# Patient Record
Sex: Female | Born: 1960 | Race: White | Hispanic: No | Marital: Married | Smoking: Former smoker
Health system: Southern US, Community
[De-identification: ages and names within clinical notes are randomized; demographics above are authoritative.]

## PROBLEM LIST (undated history)

## (undated) DIAGNOSIS — F419 Anxiety disorder, unspecified: Secondary | ICD-10-CM

## (undated) DIAGNOSIS — F32A Depression, unspecified: Secondary | ICD-10-CM

## (undated) DIAGNOSIS — N200 Calculus of kidney: Secondary | ICD-10-CM

## (undated) DIAGNOSIS — K8020 Calculus of gallbladder without cholecystitis without obstruction: Secondary | ICD-10-CM

## (undated) DIAGNOSIS — F329 Major depressive disorder, single episode, unspecified: Secondary | ICD-10-CM

## (undated) DIAGNOSIS — D649 Anemia, unspecified: Secondary | ICD-10-CM

## (undated) HISTORY — DX: Depression, unspecified: F32.A

## (undated) HISTORY — DX: Calculus of gallbladder without cholecystitis without obstruction: K80.20

## (undated) HISTORY — DX: Calculus of kidney: N20.0

## (undated) HISTORY — DX: Anxiety disorder, unspecified: F41.9

---
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
## 2018-12-12 ENCOUNTER — Encounter: Payer: Self-pay | Admitting: Orthopaedic Surgery

## 2018-12-12 ENCOUNTER — Ambulatory Visit (INDEPENDENT_AMBULATORY_CARE_PROVIDER_SITE_OTHER): Payer: Commercial Managed Care - PPO

## 2018-12-12 ENCOUNTER — Ambulatory Visit (INDEPENDENT_AMBULATORY_CARE_PROVIDER_SITE_OTHER): Payer: Commercial Managed Care - PPO | Admitting: Orthopaedic Surgery

## 2018-12-12 ENCOUNTER — Other Ambulatory Visit: Payer: Self-pay

## 2018-12-12 VITALS — Ht 68.0 in | Wt 195.0 lb

## 2018-12-12 DIAGNOSIS — M25562 Pain in left knee: Secondary | ICD-10-CM | POA: Diagnosis not present

## 2018-12-12 DIAGNOSIS — M2242 Chondromalacia patellae, left knee: Secondary | ICD-10-CM | POA: Diagnosis not present

## 2018-12-12 NOTE — Progress Notes (Signed)
Office Visit Note   Patient: Marie Douglas           Date of Birth: 1960/10/21           MRN: [id number] Visit Date: 12/12/2018              Requested by: No referring provider defined for this encounter. PCP: No primary care provider on file.   Assessment & Plan: Visit Diagnoses:  1. Acute pain of left knee   2. Chondromalacia patellae, left knee     Plan: Patient patient may have had partial patellar subluxation or more likely chondromalacia with small cartilage flap tear in the trochlear groove.  7 acute pain we will set her up for some physical therapy for terminal arc extensions straight leg raising isometric quad exercises.  I will recheck her again in 5 weeks.  7 persistent symptoms will obtain an MRI to rule out cartilage flap tear.  Follow-Up Instructions: Return in about 5 weeks (around 01/16/2019).   Orders:  Orders Placed This Encounter  Procedures  . XR KNEE 3 VIEW LEFT   No orders of the defined types were placed in this encounter.     Procedures: No procedures performed   Clinical Data: No additional findings.   Subjective: Chief Complaint  Patient presents with  . Left Knee - Pain    HPI 58 year old female seen with acute onset of left knee pain 3 weeks ago.  She woke up with the pain past history of patellar subluxation when she was a teenager.  She had another episode when she was standing on a chair or couch trying to reach the ceiling fan slipped and fell not sure if her knee went out before she fell or she injured it when she landed.  She has bought a knee sleeve she is using crutches she is used ice.  She has had some problems with sciatica in the past.  She denies associated back pain severe with this current knee symptoms.  Opposite right knee has had some crepitus but no subluxation problems.  Review of Systems positive sciatica depression anxiety.  No previous surgeries.  Non-smoker.  14 point system otherwise noncontributory to HPI.    Objective: Vital Signs: Ht 5\' 8"  (1.727 m)   Wt 195 lb (88.5 kg)   BMI 29.65 kg/m   Physical Exam Constitutional:      Appearance: She is well-developed.  HENT:     Head: Normocephalic.     Right Ear: External ear normal.     Left Ear: External ear normal.  Eyes:     Pupils: Pupils are equal, round, and reactive to light.  Neck:     Thyroid: No thyromegaly.     Trachea: No tracheal deviation.  Cardiovascular:     Rate and Rhythm: Normal rate.  Pulmonary:     Effort: Pulmonary effort is normal.  Abdominal:     Palpations: Abdomen is soft.  Skin:    General: Skin is warm and dry.  Neurological:     Mental Status: She is alert and oriented to person, place, and time.  Psychiatric:        Behavior: Behavior normal.     Ortho Exam patient is unable to ambulate without her crutches.  She has 2+ knee effusion.  Mild tenderness over the medial patellofemoral ligament negative lateral subluxation of the patella.  Distal pulses are intact.  Negative logroll of the hips.  Opposite right knee shows no knee effusion.  Pain with  patellofemoral compression.  She has sharp pain with attempts at active knee extension cannot make it the last 30 degrees. Negative patellar apprehension test. Specialty Comments:  No specialty comments available.  Imaging: Xr Knee 3 View Left  Result Date: 12/12/2018 Three-view x-rays left knee obtained and reviewed including AP lateral and sunrise patellar x-ray.  This is negative for acute changes.  There is some calcification at the attachment of the patella of the region of the medial patellofemoral ligament.  No definite loose body is seen.  Mild intercondylar eminence spurring. Impression: Left knee x-rays negative for acute changes.    PMFS History: There are no active problems to display for this patient.  Past Medical History:  Diagnosis Date  . Anxiety   . Depression     No family history on file.  History reviewed. No pertinent surgical  history. Social History   Occupational History  . Not on file  Tobacco Use  . Smoking status: Former Smoker    Packs/day: 1.00    Years: 10.00    Pack years: 10.00    Types: Cigarettes  . Smokeless tobacco: Never Used  Substance and Sexual Activity  . Alcohol use: Not Currently  . Drug use: Not on file  . Sexual activity: Not on file

## 2018-12-16 ENCOUNTER — Encounter (HOSPITAL_COMMUNITY): Payer: Self-pay | Admitting: Physical Therapy

## 2018-12-16 ENCOUNTER — Ambulatory Visit (HOSPITAL_COMMUNITY): Payer: Commercial Managed Care - PPO | Attending: Orthopaedic Surgery | Admitting: Physical Therapy

## 2018-12-16 ENCOUNTER — Other Ambulatory Visit: Payer: Self-pay

## 2018-12-16 DIAGNOSIS — M6281 Muscle weakness (generalized): Secondary | ICD-10-CM | POA: Insufficient documentation

## 2018-12-16 DIAGNOSIS — R600 Localized edema: Secondary | ICD-10-CM | POA: Diagnosis present

## 2018-12-16 DIAGNOSIS — M25662 Stiffness of left knee, not elsewhere classified: Secondary | ICD-10-CM | POA: Insufficient documentation

## 2018-12-16 DIAGNOSIS — M25562 Pain in left knee: Secondary | ICD-10-CM | POA: Insufficient documentation

## 2018-12-16 NOTE — Patient Instructions (Signed)
Quad Set    With other leg bent, foot flat, slowly tighten muscles on thigh of straight leg while counting out loud to __5__. Repeat with other leg. Repeat _10___ times. Do __1-2__ sessions per day.  http://gt2.exer.us/276   Copyright  VHI. All rights reserved.   

## 2018-12-16 NOTE — Therapy (Signed)
[place], [place], [address] Phone: [phone]   Fax:  [phone]  Physical Therapy Evaluation  Patient Details  Name: Marie Douglas MRN: [id number] Date of Birth: [birth year] Referring Provider (PT): Marybelle Killings, MD   Encounter Date: 12/16/2018  PT End of Session - 12/16/18 1408    Visit Number  1    Number of Visits  12    Date for PT Re-Evaluation  01/27/19   Mini Re-assess 01/06/19   Authorization Type  [place] Time Period  12/16/18 - 01/27/19    Authorization - Visit Number  1    Authorization - Number of Visits  10    PT Start Time  0845   Patient arrived late   PT Stop Time  0930    PT Time Calculation (min)  45 min    Equipment Utilized During Treatment  Gait belt    Activity Tolerance  Patient tolerated treatment well;Patient limited by pain    Behavior During Therapy  [facility] for tasks assessed/performed       Past Medical History:  Diagnosis Date  . Anxiety   . Depression     History reviewed. No pertinent surgical history.  There were no vitals filed for this visit.   Subjective Assessment - 12/16/18 0852    Subjective  Patient reported that about 3 weeks ago she woke up and her left knee was very painful. She said she was having some difficulty with ambulation following this. She stated she ordered a soft brace to help reduce the pain, but it didn't help. Patient reported that in 2004 her left knee had dislocated when she was on a couch reaching up and fell. She also reported that in 1977 that her knee had dislocated. She said both times the knee dislocated laterally. Patient reported that she's been using crutches to help her walk. Patient reported sometimes the back of her knee hurts. Patient denied any bowel and bladder changes and no tingling numbness.    Pertinent History  Patellar dislocation 2004 and one in 1977 per patient report    Limitations  Standing;Walking     How long can you sit comfortably?  Not nececearrily limited    How long can you stand comfortably?  less than 1 minute with weight on it    How long can you walk comfortably?  For several minutes with crutches    Diagnostic tests  X-ray of left knee negative for acute changes    Patient Stated Goals  Less pain    Currently in Pain?  Yes    Pain Score  3     Pain Location  Knee    Pain Orientation  Left    Pain Descriptors / Indicators  Aching    Pain Type  Acute pain    Pain Onset  1 to 4 weeks ago    Pain Frequency  Intermittent    Aggravating Factors   Standing, walking    Pain Relieving Factors  No activity    Effect of Pain on Daily Activities  severely limited         [facility] PT Assessment - 12/16/18 0001      Assessment   Medical Diagnosis  Acute pain of left knee, Chondromalcia patella, chondromalacia patellae, Left knee    Referring Provider (PT)  Marybelle Killings, MD    Onset Date/Surgical Date  --   About 3 weeks ago  Prior Therapy  Yes, when knee dislocated      Precautions   Precautions  Other (comment)   History of left knee dislocations     Restrictions   Weight Bearing Restrictions  No      Balance Screen   Has the patient fallen in the past 6 months  No    Has the patient had a decrease in activity level because of a fear of falling?   Yes    Is the patient reluctant to leave their home because of a fear of falling?   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   Overall Cognitive Status  Within Functional Limits for tasks  assessed      Observation/Other Assessments   Observations  Noted left knee swelling    Skin Integrity  No redness, or abrasions    Focus on Therapeutic Outcomes (FOTO)   70% limited      Observation/Other Assessments-Edema    Edema  Circumferential      Circumferential Edema   Circumferential - Right  At joint line 38 cm    Circumferential - Left   At joint line 40 cm      Sensation   Light Touch  Appears Intact      ROM / Strength   AROM / PROM / Strength  Strength;AROM      AROM   AROM Assessment Site  Knee    Right/Left Knee  Right;Left    Right Knee Extension  0    Right Knee Flexion  136    Left Knee Extension  15    Left Knee Flexion  91      Strength   Strength Assessment Site  Hip;Knee;Ankle    Right/Left Hip  Right;Left    Right Hip Flexion  5/5    Right Hip Extension  2+/5   Tested in sidelying as patient unable to roll prone   Right Hip ABduction  5/5    Left Hip Flexion  4-/5    Left Hip Extension  2-/5   Tested in sidelying as patient unable to roll prone   Left Hip ABduction  2+/5    Right/Left Knee  Right;Left    Right Knee Flexion  5/5    Right Knee Extension  5/5    Left Knee Flexion  3-/5    Left Knee Extension  2+/5   limited by pain   Right/Left Ankle  Right;Left    Right Ankle Dorsiflexion  5/5    Left Ankle Dorsiflexion  5/5      Palpation   Patella mobility  Apprehension sign with lateral movement of patella; slightly hypermobile with lateral glide    Palpation comment  Patient reported tenderness to palpation when palpating medially to patella and with patella mobility testing      Special Tests   Other special tests  LCL, MCL, ACL, PCL  testing negative bilaterally      Ambulation/Gait   Ambulation/Gait  Yes    Assistive device  Crutches    Gait Pattern  Step-to pattern;Decreased step length - right;Decreased stance time - left;Decreased weight shift to left;Antalgic    Gait velocity  --   Decreased   Gait Comments  Patient  required approximately 2 minutes to ambulate into and out of the clinic using crutches and a step-to gait pattern      Balance   Balance Assessed  Yes      Static Standing Balance   Static Standing - Balance Support  No upper extremity supported    Static Standing - Comment/# of Minutes  0 seconds on the left; 15 secs on the right                Objective measurements completed on examination: See above findings.              PT Education - 12/16/18 1407    Education Details  Educated on examination findings, plan of care, and HEP. Educated on [place].     Person(s) Educated  Patient;Spouse    Methods  Explanation    Comprehension  Verbalized understanding       PT Short Term Goals - 12/16/18 1411      PT SHORT TERM GOAL #1   Title  Patient will report understanding and regular compliance with HEP to improve strength, ROM, and overall functional mobility.     Time  3    Period  Weeks    Status  New    Target Date  01/06/19      PT SHORT TERM GOAL #2   Title  Patient will demonstrate left knee AROM from 5 extension to 105 degrees flexion in order to improve gait mechanics and safety with ambulation.     Time  3    Period  Weeks    Status  New    Target Date  01/06/19      PT SHORT TERM GOAL #3   Title  Patient will demonstrate improvement of at least 1/2 MMT strength grade in deficient muscle groups of the left lower extremity to improve gait mechanics and safety during ambulation.    Time  3    Period  Weeks        PT Long Term Goals - 12/16/18 1422      PT LONG TERM GOAL #1   Title  Patient will demonstrate left knee AROM from 0 extension to at least 120 degrees flexion in order to improve gait mechanics and safety with ambulation.     Time  6    Period  Weeks    Status  New    Target Date  01/27/19      PT LONG TERM GOAL #2   Title  Patient will demonstrate improvement of at least 1 MMT strength grade in deficient muscle groups of the left  lower extremity to improve gait mechanics and ability to get into and out of a vehicle with greater ease.     Time  6    Period  Weeks    Status  New    Target Date  01/27/19      PT LONG TERM GOAL #3   Title  Patient will demonstrate ability to maintain SLS for at least 5 seconds on the left lower extremity to improve safety with stair negotiation.    Time  6    Period  Weeks    Status  New    Target Date  01/27/19      PT LONG TERM GOAL #4   Title  Patient will demonstrate ability to ambulate at a rate of at least 0.8 m/s on the 2MWT indicating improved safety and mechanics with ambulation to walk at home and in the community.     Time  6    Period  Weeks    Status  New    Target Date  01/27/19             Plan - 12/16/18 1452    Clinical Impression Statement  Patient is a 58 year old female with acute onset of left knee pain. Upon examination she demonstrated decreased left knee AROM, decreased left lower extremity strength as well as localized edema around her left knee. Patient demonstrated deficits in balance as she was unable to maintain single limb stance on the left lower extremity for any length of time. Patient demonstrated deficits with ambulation with decreased velocity and many gait deficits as well on heavy reliance on 2 axillary crutches. Ligament testing of the knee was negative, and patient had a positive apprehension sign on the left suggesting possible history of recent subluxation. Patient was educated on examination findings, benefits of icing, and plan of care. Patient would benefit from continued skilled physical therapy in order to address the abovementioned deficits and help patient return to her prior level of function.     Personal Factors and Comorbidities  Comorbidity 2    Comorbidities  Depression, anxiety    Examination-Activity Limitations  Bathing;Stairs;Stand;Bend;Lift;Locomotion Level;Squat    Examination-Participation Restrictions  Yard  Work;Cleaning;Laundry;Community Activity;Shop;Driving;Volunteer    Stability/Clinical Decision Making  Evolving/Moderate complexity    Clinical Decision Making  Moderate    Rehab Potential  Good    PT Frequency  2x / week    PT Duration  6 weeks    PT Treatment/Interventions  ADLs/Self Care Home Management;Cryotherapy;Electrical Stimulation;Moist Heat;DME Instruction;Gait training;Stair training;Functional mobility training;Therapeutic activities;Therapeutic exercise;Balance training;Neuromuscular re-education;Patient/family education;Orthotic Fit/Training;Manual techniques;Passive range of motion;Dry needling;Energy conservation;Splinting;Taping    PT Next Visit Plan  Review evaluation and goals, perform 2MWT. Focus on improving ROM, reducing edema, gait training, and decreasing pain initially. Progress to strengthening and balance as able. MD referral stated: "SLR, isometrics, Terminal Arc Extensions, quad strengthening"     PT Home Exercise Plan  Evaluation: Quad set 1 x 10 5'' holds    Consulted and Agree with Plan of Care  Patient       Patient will benefit from skilled therapeutic intervention in order to improve the following deficits and impairments:  Abnormal gait, Decreased balance, Decreased endurance, Decreased mobility, Difficulty walking, Decreased range of motion, Increased edema, Decreased activity tolerance, Decreased strength, Pain  Visit Diagnosis: Acute pain of left knee  Stiffness of left knee, not elsewhere classified  Muscle weakness (generalized)  Localized edema     Problem List There are no active problems to display for this patient.  Clarene Critchley PT, DPT 3:12 PM, 12/16/18 [place], [place], [address] Phone: [phone]   Fax:  [phone]  Name: Marie Douglas MRN: [id number] Date of Birth: [birth year]

## 2018-12-20 ENCOUNTER — Ambulatory Visit (HOSPITAL_COMMUNITY): Payer: Commercial Managed Care - PPO

## 2018-12-20 ENCOUNTER — Other Ambulatory Visit: Payer: Self-pay

## 2018-12-20 ENCOUNTER — Encounter (HOSPITAL_COMMUNITY): Payer: Self-pay

## 2018-12-20 DIAGNOSIS — M6281 Muscle weakness (generalized): Secondary | ICD-10-CM

## 2018-12-20 DIAGNOSIS — M25562 Pain in left knee: Secondary | ICD-10-CM | POA: Diagnosis not present

## 2018-12-20 DIAGNOSIS — M25662 Stiffness of left knee, not elsewhere classified: Secondary | ICD-10-CM

## 2018-12-20 DIAGNOSIS — R600 Localized edema: Secondary | ICD-10-CM

## 2018-12-20 NOTE — Therapy (Signed)
[place], [place], [address] Phone: [phone]   Fax:  [phone]  Physical Therapy Treatment  Patient Details  Name: Marie Douglas MRN: [id number] Date of Birth: Apr 25, 1961 Referring Provider (PT): Marybelle Killings, MD   Encounter Date: 12/20/2018  PT End of Session - 12/20/18 1018    Visit Number  2    Number of Visits  12    Date for PT Re-Evaluation  01/27/19   Mini reassess 01/06/19   Authorization Type  [place] Time Period  12/16/18 - 01/27/19    Authorization - Visit Number  2    Authorization - Number of Visits  10    PT Start Time  1011    PT Stop Time  7341    PT Time Calculation (min)  40 min    Activity Tolerance  Patient tolerated treatment well;No increased pain    Behavior During Therapy  WFL for tasks assessed/performed       Past Medical History:  Diagnosis Date  . Anxiety   . Depression     History reviewed. No pertinent surgical history.  There were no vitals filed for this visit.  Subjective Assessment - 12/20/18 1017    Subjective  Pt. arrived with Bil crutches, current Lt knee is pain free.  Reports difficulty with stairs and sit to stands.  Reports compliance with HEP daily wihtout questions and has been icing knee regulary.      Pertinent History  Patellar dislocation 2004 and one in 1977 per patient report    Diagnostic tests  X-ray of left knee negative for acute changes    Currently in Pain?  No/denies         [facility] PT Assessment - 12/20/18 0001      Assessment   Medical Diagnosis  Acute pain of left knee, Chondromalcia patella, chondromalacia patellae, Left knee    Referring Provider (PT)  Marybelle Killings, MD    Onset Date/Surgical Date  --   About 3 weeks ago   Prior Therapy  Yes, when knee dislocated      Precautions   Precautions  Other (comment)   History of Lt knee dislocation     AROM   Left Knee Extension  8    Left Knee Flexion  115      Ambulation/Gait   Ambulation/Gait  Yes    Ambulation Distance (Feet)  88 Feet    Assistive device  Crutches    Gait Pattern  Step-to pattern;Decreased step length - right;Decreased stance time - left;Decreased weight shift to left;Antalgic    Gait velocity  .22 m/s    Gait Comments  Cueing for appropriate sequence with Bil crutches included posture, sequence and appropriate weight bearing and heel to toe pattern; step thru pattern                   [facility] Adult PT Treatment/Exercise - 12/20/18 0001      Exercises   Exercises  Knee/Hip      Knee/Hip Exercises: Standing   Terminal Knee Extension  15 reps    Theraband Level (Terminal Knee Extension)  Other (comment)    Terminal Knee Extension Limitations  5" holds pushing into therapist hand      Knee/Hip Exercises: Supine   Quad Sets  AROM;10 reps;Limitations    Quad Sets Limitations  10"    Heel Slides  10 reps    Straight  Leg Raises  AAROM;5 reps    Straight Leg Raises Limitations  AAROM    Other Supine Knee/Hip Exercises  marching 10x    Other Supine Knee/Hip Exercises  attempted TKE, unable               PT Short Term Goals - 12/16/18 1411      PT SHORT TERM GOAL #1   Title  Patient will report understanding and regular compliance with HEP to improve strength, ROM, and overall functional mobility.     Time  3    Period  Weeks    Status  New    Target Date  01/06/19      PT SHORT TERM GOAL #2   Title  Patient will demonstrate left knee AROM from 5 extension to 105 degrees flexion in order to improve gait mechanics and safety with ambulation.     Time  3    Period  Weeks    Status  New    Target Date  01/06/19      PT SHORT TERM GOAL #3   Title  Patient will demonstrate improvement of at least 1/2 MMT strength grade in deficient muscle groups of the left lower extremity to improve gait mechanics and safety during ambulation.    Time  3    Period  Weeks        PT Long Term Goals - 12/16/18  1422      PT LONG TERM GOAL #1   Title  Patient will demonstrate left knee AROM from 0 extension to at least 120 degrees flexion in order to improve gait mechanics and safety with ambulation.     Time  6    Period  Weeks    Status  New    Target Date  01/27/19      PT LONG TERM GOAL #2   Title  Patient will demonstrate improvement of at least 1 MMT strength grade in deficient muscle groups of the left lower extremity to improve gait mechanics and ability to get into and out of a vehicle with greater ease.     Time  6    Period  Weeks    Status  New    Target Date  01/27/19      PT LONG TERM GOAL #3   Title  Patient will demonstrate ability to maintain SLS for at least 5 seconds on the left lower extremity to improve safety with stair negotiation.    Time  6    Period  Weeks    Status  New    Target Date  01/27/19      PT LONG TERM GOAL #4   Title  Patient will demonstrate ability to ambulate at a rate of at least 0.8 m/s on the 2MWT indicating improved safety and mechanics with ambulation to walk at home and in the community.     Time  6    Period  Weeks    Status  New    Target Date  01/27/19            Plan - 12/20/18 1206    Clinical Impression Statement  Began session with gait training to improve mechanics with cueing for posture, heel to toe pattern, appropriate sequence with Bil axillary crutches, pt able to demonstrate improved step thru gait mechanics.  2MWT complete with ability to complete 67ft, velocity at .22352 with gait speed that is a fall risk, no LOB episodes during 2MWT.  Reviewed  goals with pt who agrees wiht goals and reviewed mechanics iwht HEP.  Pt able to demonstrate appropriate contraction with quad sets.  Session focus on knee mobility and quad strengthening.  Pt making great gains with improved AROM to 8-115 degrees was (15-91 degrees last session.)  Pt with increased difficulty at distal quad extension with inability to complete TKE/SAQ exercises. EOS  pt reports no pain.  Pt encouraged to improve focus on appropriate gait mechanics wiht crutches, no additional exercises given as HEP just to continue quad sets.      Personal Factors and Comorbidities  Comorbidity 2    Comorbidities  Depression, anxiety    Examination-Activity Limitations  Bathing;Stairs;Stand;Bend;Lift;Locomotion Level;Squat    Examination-Participation Restrictions  Yard Work;Cleaning;Laundry;Community Activity;Shop;Driving;Volunteer    Stability/Clinical Decision Making  Evolving/Moderate complexity    Rehab Potential  Good    PT Frequency  2x / week    PT Duration  6 weeks    PT Treatment/Interventions  ADLs/Self Care Home Management;Cryotherapy;Electrical Stimulation;Moist Heat;DME Instruction;Gait training;Stair training;Functional mobility training;Therapeutic activities;Therapeutic exercise;Balance training;Neuromuscular re-education;Patient/family education;Orthotic Fit/Training;Manual techniques;Passive range of motion;Dry needling;Energy conservation;Splinting;Taping    PT Next Visit Plan  Focus on improving ROM, reducing edema, gait training, and decreasing pain initially. Progress to strengthening and balance as able. MD referral stated: "SLR, isometrics, Terminal Arc Extensions, quad strengthening"     PT Home Exercise Plan  Evaluation: Quad set 1 x 10 5'' holds' 12/20/18 focus on gait mechanics with crutches    Consulted and Agree with Plan of Care  Patient       Patient will benefit from skilled therapeutic intervention in order to improve the following deficits and impairments:  Abnormal gait, Decreased balance, Decreased endurance, Decreased mobility, Difficulty walking, Decreased range of motion, Increased edema, Decreased activity tolerance, Decreased strength, Pain  Visit Diagnosis: Acute pain of left knee  Stiffness of left knee, not elsewhere classified  Muscle weakness (generalized)  Localized edema     Problem List There are no active problems  to display for this patient.  [address], LPTA; [place]  Aldona Lento 12/20/2018, 12:14 PM  [place] [address] [place], [place], [address] Phone: [phone]   Fax:  [phone]  Name: Darlynn Ricco MRN: [id number] Date of Birth: [birth year]

## 2018-12-23 ENCOUNTER — Ambulatory Visit (HOSPITAL_COMMUNITY): Payer: Commercial Managed Care - PPO

## 2018-12-23 ENCOUNTER — Other Ambulatory Visit: Payer: Self-pay

## 2018-12-23 ENCOUNTER — Encounter (HOSPITAL_COMMUNITY): Payer: Self-pay

## 2018-12-23 DIAGNOSIS — M25662 Stiffness of left knee, not elsewhere classified: Secondary | ICD-10-CM

## 2018-12-23 DIAGNOSIS — R600 Localized edema: Secondary | ICD-10-CM

## 2018-12-23 DIAGNOSIS — M25562 Pain in left knee: Secondary | ICD-10-CM | POA: Diagnosis not present

## 2018-12-23 DIAGNOSIS — M6281 Muscle weakness (generalized): Secondary | ICD-10-CM

## 2018-12-23 NOTE — Therapy (Signed)
[place], [place], [address] Phone: [phone]   Fax:  [phone]  Physical Therapy Treatment  Patient Details  Name: Marie Douglas MRN: [id number] Date of Birth: January 28, 1961 Referring Provider (PT): Marybelle Killings, MD   Encounter Date: 12/23/2018  PT End of Session - 12/23/18 1705    Visit Number  3    Number of Visits  12    Date for PT Re-Evaluation  01/27/19   Minireassess 01/06/19   Authorization Type  [place] Time Period  12/16/18 - 01/27/19    Authorization - Visit Number  3    Authorization - Number of Visits  10    PT Start Time  4827    PT Stop Time  1654    PT Time Calculation (min)  43 min    Equipment Utilized During Treatment  Gait belt    Activity Tolerance  Patient tolerated treatment well;No increased pain    Behavior During Therapy  WFL for tasks assessed/performed       Past Medical History:  Diagnosis Date  . Anxiety   . Depression     History reviewed. No pertinent surgical history.  There were no vitals filed for this visit.  Subjective Assessment - 12/23/18 1619    Subjective  Pt reports compliance with HEP, arrived ambulating with Bil crutches.  Current pain scale 2/10 Lt knee.      Patient Stated Goals  Less pain    Currently in Pain?  Yes    Pain Score  2     Pain Location  Knee    Pain Orientation  Left    Pain Descriptors / Indicators  Aching    Pain Type  Acute pain    Pain Onset  1 to 4 weeks ago    Pain Frequency  Intermittent    Aggravating Factors   Standing, walking    Pain Relieving Factors  No activity    Effect of Pain on Daily Activities  severly limited                       [facility] Adult PT Treatment/Exercise - 12/23/18 0001      Ambulation/Gait   Ambulation/Gait  Yes    Ambulation Distance (Feet)  226 Feet    Assistive device  R Axillary Crutch    Gait Pattern  Step-to pattern;Decreased step length -  right;Decreased stance time - left;Decreased weight shift to left;Antalgic    Gait Comments  Cueing for sequence      Knee/Hip Exercises: Standing   Terminal Knee Extension  15 reps    Theraband Level (Terminal Knee Extension)  Other (comment)    Terminal Knee Extension Limitations  5" holds pushing into therapist hand    Lateral Step Up  Left;10 reps;Hand Hold: 2;Step Height: 2"    Lateral Step Up Limitations  cueing for mechanics      Knee/Hip Exercises: Supine   Quad Sets  AROM;10 reps;Limitations    Quad Sets Limitations  10" holds    Short Arc Quad Sets  AAROM;5 reps    Heel Slides  10 reps    Straight Leg Raises  AAROM;10 reps    Straight Leg Raises Limitations  AAROM, extension lag               PT Short Term Goals - 12/16/18 1411      PT SHORT TERM GOAL #1  Title  Patient will report understanding and regular compliance with HEP to improve strength, ROM, and overall functional mobility.     Time  3    Period  Weeks    Status  New    Target Date  01/06/19      PT SHORT TERM GOAL #2   Title  Patient will demonstrate left knee AROM from 5 extension to 105 degrees flexion in order to improve gait mechanics and safety with ambulation.     Time  3    Period  Weeks    Status  New    Target Date  01/06/19      PT SHORT TERM GOAL #3   Title  Patient will demonstrate improvement of at least 1/2 MMT strength grade in deficient muscle groups of the left lower extremity to improve gait mechanics and safety during ambulation.    Time  3    Period  Weeks        PT Long Term Goals - 12/16/18 1422      PT LONG TERM GOAL #1   Title  Patient will demonstrate left knee AROM from 0 extension to at least 120 degrees flexion in order to improve gait mechanics and safety with ambulation.     Time  6    Period  Weeks    Status  New    Target Date  01/27/19      PT LONG TERM GOAL #2   Title  Patient will demonstrate improvement of at least 1 MMT strength grade in  deficient muscle groups of the left lower extremity to improve gait mechanics and ability to get into and out of a vehicle with greater ease.     Time  6    Period  Weeks    Status  New    Target Date  01/27/19      PT LONG TERM GOAL #3   Title  Patient will demonstrate ability to maintain SLS for at least 5 seconds on the left lower extremity to improve safety with stair negotiation.    Time  6    Period  Weeks    Status  New    Target Date  01/27/19      PT LONG TERM GOAL #4   Title  Patient will demonstrate ability to ambulate at a rate of at least 0.8 m/s on the 2MWT indicating improved safety and mechanics with ambulation to walk at home and in the community.     Time  6    Period  Weeks    Status  New    Target Date  01/27/19            Plan - 12/23/18 1706    Clinical Impression Statement  Pt instructed gait mechanics with one crutch, min cueing for sequence and equal stride length.  Session focus with knee mobility and quad strengthening.  Mobility progressing well with improved AROM 4-134 degrees (was 8-115 degrees last session.)  Pt continues to demonstrate weakness in quadriceps with increased difficulty wiht TKE and extension lag wiht SLR.  Pt given advanced HEP wiht SLR and LAQ, encouraged to meet full extension with exercise.  Added lateral step ups wiht multimodal cueing for proper form to reduce compensation with hip hikes and UE support.      Personal Factors and Comorbidities  Comorbidity 2    Comorbidities  Depression, anxiety    Examination-Activity Limitations  Bathing;Stairs;Stand;Bend;Lift;Locomotion Level;Squat    Examination-Participation Restrictions  Yard Work;Cleaning;Laundry;Community Activity;Shop;Driving;Volunteer    Stability/Clinical Decision Making  Evolving/Moderate complexity    Rehab Potential  Good    PT Frequency  2x / week    PT Duration  6 weeks    PT Treatment/Interventions  ADLs/Self Care Home Management;Cryotherapy;Electrical  Stimulation;Moist Heat;DME Instruction;Gait training;Stair training;Functional mobility training;Therapeutic activities;Therapeutic exercise;Balance training;Neuromuscular re-education;Patient/family education;Orthotic Fit/Training;Manual techniques;Passive range of motion;Dry needling;Energy conservation;Splinting;Taping    PT Next Visit Plan  Focus on improving ROM, reducing edema, gait training, and decreasing pain initially. Progress to strengthening and balance as able. MD referral stated: "SLR, isometrics, Terminal Arc Extensions, quad strengthening"     PT Home Exercise Plan  Evaluation: Quad set 1 x 10 5'' holds' 12/20/18 focus on gait mechanics with crutches       Patient will benefit from skilled therapeutic intervention in order to improve the following deficits and impairments:  Abnormal gait, Decreased balance, Decreased endurance, Decreased mobility, Difficulty walking, Decreased range of motion, Increased edema, Decreased activity tolerance, Decreased strength, Pain  Visit Diagnosis: Acute pain of left knee  Stiffness of left knee, not elsewhere classified  Muscle weakness (generalized)  Localized edema     Problem List There are no active problems to display for this patient.  [address] LPTA; [place]  Aldona Lento 12/23/2018, 5:13 PM  [place], [place], [address] Phone: [phone]   Fax:  [phone]  Name: Marie Douglas MRN: [id number] Date of Birth: 11/21/1960

## 2018-12-23 NOTE — Patient Instructions (Signed)
Straight Leg Raise    Tighten stomach and slowly raise locked right leg 12 inches from floor. Repeat 10 times per set. Do 1-2 sets per session. Do 2 sessions per day.  http://orth.exer.us/1103   Copyright  VHI. All rights reserved.   Long [employer]    Straighten operated leg and try to hold it 5 seconds.  Repeat 10 times. Do 2 sessions a day.  http://gt2.exer.us/311   Copyright  VHI. All rights reserved.

## 2018-12-27 ENCOUNTER — Ambulatory Visit (HOSPITAL_COMMUNITY): Payer: Commercial Managed Care - PPO

## 2018-12-27 ENCOUNTER — Other Ambulatory Visit: Payer: Self-pay

## 2018-12-27 ENCOUNTER — Encounter (HOSPITAL_COMMUNITY): Payer: Self-pay

## 2018-12-27 DIAGNOSIS — M25562 Pain in left knee: Secondary | ICD-10-CM

## 2018-12-27 DIAGNOSIS — M25662 Stiffness of left knee, not elsewhere classified: Secondary | ICD-10-CM

## 2018-12-27 DIAGNOSIS — R600 Localized edema: Secondary | ICD-10-CM

## 2018-12-27 DIAGNOSIS — M6281 Muscle weakness (generalized): Secondary | ICD-10-CM

## 2018-12-27 NOTE — Therapy (Signed)
[place], [place], [address] Phone: [phone]   Fax:  [phone]  Physical Therapy Treatment  Patient Details  Name: Marie Douglas MRN: [id number] Date of Birth: 1960-09-22 Referring Provider (PT): Marybelle Killings, MD   Encounter Date: 12/27/2018  PT End of Session - 12/27/18 1517    Visit Number  4    Number of Visits  12    Date for PT Re-Evaluation  01/27/19   Minireassess 01/06/19   Authorization Type  [place] Time Period  12/16/18 - 01/27/19    Authorization - Visit Number  4    Authorization - Number of Visits  10    PT Start Time  4401    PT Stop Time  0272    PT Time Calculation (min)  46 min    Activity Tolerance  Patient tolerated treatment well;No increased pain    Behavior During Therapy  WFL for tasks assessed/performed       Past Medical History:  Diagnosis Date  . Anxiety   . Depression     History reviewed. No pertinent surgical history.  There were no vitals filed for this visit.  Subjective Assessment - 12/27/18 1508    Subjective  Pt stated she is feeling good today, no reports of pain.  Has began new HEP, reports increased ease with exercises.    Pertinent History  Patellar dislocation 2004 and one in 1977 per patient report    Currently in Pain?  No/denies         [facility] PT Assessment - 12/27/18 0001      Assessment   Medical Diagnosis  Acute pain of left knee, Chondromalcia patella, chondromalacia patellae, Left knee    Referring Provider (PT)  Marybelle Killings, MD    Onset Date/Surgical Date  --   About 3 weeks ago   Next MD Visit  01/16/19    Prior Therapy  Yes, when knee dislocated      Precautions   Precautions  Other (comment)   History of knee dislocation     AROM   Left Knee Extension  0    Left Knee Flexion  134                   [facility] Adult PT Treatment/Exercise - 12/27/18 0001      Ambulation/Gait   Ambulation/Gait  Yes    Ambulation Distance (Feet)  226 Feet    Assistive device  R Axillary Crutch    Gait Pattern  Step-through pattern    Gait Comments  Cueing for sequence      Exercises   Exercises  Knee/Hip      Knee/Hip Exercises: Standing   Heel Raises  15 reps    Terminal Knee Extension  15 reps    Theraband Level (Terminal Knee Extension)  Level 2 (Red)    Terminal Knee Extension Limitations  5" holds     Lateral Step Up  Left;10 reps;Hand Hold: 2;Step Height: 2";Step Height: 4"    Lateral Step Up Limitations  cueing for mechanics    Forward Step Up  Left;10 reps;Hand Hold: 1;Step Height: 4"    Wall Squat  10 reps;3 seconds    Wall Squat Limitations  ball squeeze    Rocker Board  1 minute    Rocker Board Limitations  lateral wiht cueing for form    Gait Training  Outdoor gait with 1 crutch on  uneven terrain      Knee/Hip Exercises: Supine   Short Arc Quad Sets  AROM;15 reps    Terminal Knee Extension  10 reps    Terminal Knee Extension Limitations  CUeing for mechanics    Straight Leg Raises  AROM;2 sets;10 reps               PT Short Term Goals - 12/16/18 1411      PT SHORT TERM GOAL #1   Title  Patient will report understanding and regular compliance with HEP to improve strength, ROM, and overall functional mobility.     Time  3    Period  Weeks    Status  New    Target Date  01/06/19      PT SHORT TERM GOAL #2   Title  Patient will demonstrate left knee AROM from 5 extension to 105 degrees flexion in order to improve gait mechanics and safety with ambulation.     Time  3    Period  Weeks    Status  New    Target Date  01/06/19      PT SHORT TERM GOAL #3   Title  Patient will demonstrate improvement of at least 1/2 MMT strength grade in deficient muscle groups of the left lower extremity to improve gait mechanics and safety during ambulation.    Time  3    Period  Weeks        PT Long Term Goals - 12/16/18 1422      PT LONG TERM GOAL #1   Title  Patient will  demonstrate left knee AROM from 0 extension to at least 120 degrees flexion in order to improve gait mechanics and safety with ambulation.     Time  6    Period  Weeks    Status  New    Target Date  01/27/19      PT LONG TERM GOAL #2   Title  Patient will demonstrate improvement of at least 1 MMT strength grade in deficient muscle groups of the left lower extremity to improve gait mechanics and ability to get into and out of a vehicle with greater ease.     Time  6    Period  Weeks    Status  New    Target Date  01/27/19      PT LONG TERM GOAL #3   Title  Patient will demonstrate ability to maintain SLS for at least 5 seconds on the left lower extremity to improve safety with stair negotiation.    Time  6    Period  Weeks    Status  New    Target Date  01/27/19      PT LONG TERM GOAL #4   Title  Patient will demonstrate ability to ambulate at a rate of at least 0.8 m/s on the 2MWT indicating improved safety and mechanics with ambulation to walk at home and in the community.     Time  6    Period  Weeks    Status  New    Target Date  01/27/19            Plan - 12/27/18 1655    Clinical Impression Statement  Pt progressing well with knee mobility and strengthening.  AROM 0-134 degrees.  Quad strength progressing well wtih ability to complete SLR without extension lag and able to complete SAQ with appropriate form and mechanics without assistance.  Pt stated she is hesitant  to walk in yard to horses to help husband, gait training complete outside on uneven terrain with no LOB and reports of improved confidence following.  Added rocker board for equal stance phase during gait, lateral and forward step ups and wall squats for quad strengthening.  Pt able to complete with verbal, tactile and demonstrate for proper mechanics.  No reports of pain through session.    Personal Factors and Comorbidities  Comorbidity 2    Comorbidities  Depression, anxiety    Examination-Activity  Limitations  Bathing;Stairs;Stand;Bend;Lift;Locomotion Level;Squat    Examination-Participation Restrictions  Yard Work;Cleaning;Laundry;Community Activity;Shop;Driving;Volunteer    Stability/Clinical Decision Making  Evolving/Moderate complexity    Clinical Decision Making  Moderate    Rehab Potential  Good    PT Frequency  2x / week    PT Duration  6 weeks    PT Treatment/Interventions  ADLs/Self Care Home Management;Cryotherapy;Electrical Stimulation;Moist Heat;DME Instruction;Gait training;Stair training;Functional mobility training;Therapeutic activities;Therapeutic exercise;Balance training;Neuromuscular re-education;Patient/family education;Orthotic Fit/Training;Manual techniques;Passive range of motion;Dry needling;Energy conservation;Splinting;Taping    PT Next Visit Plan  Next session continue quad strengthening.  Add gluteal strenghtening as able.  Work on Industrial/product designer AD.  Progress strengthening and balance as able.  MD referral stated: "SLR, isometrics, Terminal Arc Extensions, quad strengthening."    PT Home Exercise Plan  Evaluation: Quad set 1 x 10 5'' holds' 12/20/18 focus on gait mechanics with crutches;  SLR/LAQ and ambulation wiht 1 crutch       Patient will benefit from skilled therapeutic intervention in order to improve the following deficits and impairments:  Abnormal gait, Decreased balance, Decreased endurance, Decreased mobility, Difficulty walking, Decreased range of motion, Increased edema, Decreased activity tolerance, Decreased strength, Pain  Visit Diagnosis: Acute pain of left knee  Stiffness of left knee, not elsewhere classified  Muscle weakness (generalized)  Localized edema     Problem List There are no active problems to display for this patient.  [address], LPTA; [place]  Aldona Lento 12/27/2018, [place] [address] [place], [place], [address] Phone:  [phone]   Fax:  [phone]  Name: Jinger Middlesworth MRN: [id number] Date of Birth: [birth year]

## 2018-12-30 ENCOUNTER — Ambulatory Visit (HOSPITAL_COMMUNITY): Payer: Commercial Managed Care - PPO | Admitting: Physical Therapy

## 2018-12-30 ENCOUNTER — Other Ambulatory Visit: Payer: Self-pay

## 2018-12-30 ENCOUNTER — Encounter (HOSPITAL_COMMUNITY): Payer: Self-pay | Admitting: Physical Therapy

## 2018-12-30 DIAGNOSIS — M25562 Pain in left knee: Secondary | ICD-10-CM | POA: Diagnosis not present

## 2018-12-30 DIAGNOSIS — M6281 Muscle weakness (generalized): Secondary | ICD-10-CM

## 2018-12-30 DIAGNOSIS — R600 Localized edema: Secondary | ICD-10-CM

## 2018-12-30 DIAGNOSIS — M25662 Stiffness of left knee, not elsewhere classified: Secondary | ICD-10-CM

## 2018-12-30 NOTE — Patient Instructions (Addendum)
Abduction: Side Leg Lift (Eccentric) - Side-Lying    Lie on side. Lift top leg slightly higher than shoulder level. Keep top leg straight with body, toes pointing forward. Slowly lower for 3-5 seconds. _15__ reps per set, __2_ sets per day, _3-4__ days per week.   http://ecce.exer.us/63   Copyright  VHI. All rights reserved.    With SLR, have 1 set with neutral rotation and 1 set with toes turned out.

## 2018-12-30 NOTE — Therapy (Signed)
[place], [place], [address] Phone: [phone]   Fax:  [phone]  Physical Therapy Treatment  Patient Details  Name: Marie Douglas MRN: [id number] Date of Birth: 1961/04/19 Referring Provider (PT): Marybelle Killings, MD   Encounter Date: 12/30/2018  PT End of Session - 12/30/18 0848    Visit Number  5    Number of Visits  12    Date for PT Re-Evaluation  01/27/19   Minireassess 01/06/19   Authorization Type  [employer]    Authorization Time Period  12/16/18 - 01/27/19    Authorization - Visit Number  5    Authorization - Number of Visits  10    PT Start Time  0836    PT Stop Time  3220    PT Time Calculation (min)  38 min    Activity Tolerance  Patient tolerated treatment well;No increased pain    Behavior During Therapy  WFL for tasks assessed/performed       Past Medical History:  Diagnosis Date  . Anxiety   . Depression     History reviewed. No pertinent surgical history.  There were no vitals filed for this visit.  Subjective Assessment - 12/30/18 0841    Subjective  Patient reported shes doing well. Shes been using her crutch less. No pain currently.    Pertinent History  Patellar dislocation 2004 and one in 1977 per patient report    Currently in Pain?  No/denies                       [facility] Adult PT Treatment/Exercise - 12/30/18 0001      Knee/Hip Exercises: Standing   Heel Raises  15 reps    Forward Lunges  Right;Left;1 set;10 reps    Lateral Step Up  Left;Step Height: 4";Hand Hold: 1;15 reps    Forward Step Up  Left;Step Height: 4";Hand Hold: 1;15 reps    Wall Squat  10 reps;3 seconds    Wall Squat Limitations  ball squeeze    Rocker Board  1 minute    Rocker Board Limitations  lateral wiht cueing for form    SLS with Vectors  Forward, side, back 5'' x 5 each LE 1 UE assist    Other Standing Knee Exercises  Sidestepping with RTB around bilateral thighs 15' x 2 RT      Knee/Hip Exercises: Supine   Short Arc Quad Sets  AROM;15 reps    Bridges  Strengthening;Both;15 reps    Straight Leg Raises  2 sets;10 reps;Strengthening    Straight Leg Raises Limitations  1st set neutral rotation, second set ER.       Knee/Hip Exercises: Sidelying   Hip ABduction  Strengthening;Left;1 set;15 reps;Right               PT Short Term Goals - 12/16/18 1411      PT SHORT TERM GOAL #1   Title  Patient will report understanding and regular compliance with HEP to improve strength, ROM, and overall functional mobility.     Time  3    Period  Weeks    Status  New    Target Date  01/06/19      PT SHORT TERM GOAL #2   Title  Patient will demonstrate left knee AROM from 5 extension to 105 degrees flexion in order to improve gait mechanics and safety with ambulation.     Time  3  Period  Weeks    Status  New    Target Date  01/06/19      PT SHORT TERM GOAL #3   Title  Patient will demonstrate improvement of at least 1/2 MMT strength grade in deficient muscle groups of the left lower extremity to improve gait mechanics and safety during ambulation.    Time  3    Period  Weeks        PT Long Term Goals - 12/16/18 1422      PT LONG TERM GOAL #1   Title  Patient will demonstrate left knee AROM from 0 extension to at least 120 degrees flexion in order to improve gait mechanics and safety with ambulation.     Time  6    Period  Weeks    Status  New    Target Date  01/27/19      PT LONG TERM GOAL #2   Title  Patient will demonstrate improvement of at least 1 MMT strength grade in deficient muscle groups of the left lower extremity to improve gait mechanics and ability to get into and out of a vehicle with greater ease.     Time  6    Period  Weeks    Status  New    Target Date  01/27/19      PT LONG TERM GOAL #3   Title  Patient will demonstrate ability to maintain SLS for at least 5 seconds on the left lower extremity to improve safety with stair  negotiation.    Time  6    Period  Weeks    Status  New    Target Date  01/27/19      PT LONG TERM GOAL #4   Title  Patient will demonstrate ability to ambulate at a rate of at least 0.8 m/s on the 2MWT indicating improved safety and mechanics with ambulation to walk at home and in the community.     Time  6    Period  Weeks    Status  New    Target Date  01/27/19            Plan - 12/30/18 0924    Clinical Impression Statement  This session focused on improving patient's strength as patient's left knee AROM was [facility] last session. This session began hip abduction strengthening in sidelying and added this to patient's HEP.  Also added bridges, and sidestepping with RTB, and vector stance for improved balance. Changed patient's HEP to included SLR with neutral leg rotation and external rotation. Patient without reports of pain throughout session. Plan to continue with targeted strengthening for lower extremities and functional lower extremity strengthening.    Personal Factors and Comorbidities  Comorbidity 2    Comorbidities  Depression, anxiety    Examination-Activity Limitations  Bathing;Stairs;Stand;Bend;Lift;Locomotion Level;Squat    Examination-Participation Restrictions  Yard Work;Cleaning;Laundry;Community Activity;Shop;Driving;Volunteer    Stability/Clinical Decision Making  Evolving/Moderate complexity    Rehab Potential  Good    PT Frequency  2x / week    PT Duration  6 weeks    PT Treatment/Interventions  ADLs/Self Care Home Management;Cryotherapy;Electrical Stimulation;Moist Heat;DME Instruction;Gait training;Stair training;Functional mobility training;Therapeutic activities;Therapeutic exercise;Balance training;Neuromuscular re-education;Patient/family education;Orthotic Fit/Training;Manual techniques;Passive range of motion;Dry needling;Energy conservation;Splinting;Taping    PT Next Visit Plan  Work on ambulation wihtout AD.  Progress strengthening and balance as able.  MD  referral stated: "SLR, isometrics, Terminal Arc Extensions, quad strengthening."    PT Home Exercise Plan  Evaluation: Quad set  1 x 10 5'' holds' 12/20/18 focus on gait mechanics with crutches;  SLR/LAQ and ambulation wiht 1 crutch; 12/30/18: Sidelying hip abduction 2x15 3-4 x/week; 1 set of SLR with ER    Consulted and Agree with Plan of Care  Patient       Patient will benefit from skilled therapeutic intervention in order to improve the following deficits and impairments:  Abnormal gait, Decreased balance, Decreased endurance, Decreased mobility, Difficulty walking, Decreased range of motion, Increased edema, Decreased activity tolerance, Decreased strength, Pain  Visit Diagnosis: Acute pain of left knee  Stiffness of left knee, not elsewhere classified  Muscle weakness (generalized)  Localized edema     Problem List There are no active problems to display for this patient.  Clarene Critchley PT, DPT 9:27 AM, 12/30/18 [place], [place], [address] Phone: [phone]   Fax:  [phone]  Name: Marie Douglas MRN: [id number] Date of Birth: 30-Apr-1961

## 2019-01-03 ENCOUNTER — Ambulatory Visit (HOSPITAL_COMMUNITY): Payer: Commercial Managed Care - PPO | Admitting: Physical Therapy

## 2019-01-03 ENCOUNTER — Telehealth (HOSPITAL_COMMUNITY): Payer: Self-pay | Admitting: *Deleted

## 2019-01-03 NOTE — Telephone Encounter (Signed)
01/03/19  Pt left a message to cancel today but no reason was given

## 2019-01-06 ENCOUNTER — Encounter (HOSPITAL_COMMUNITY): Payer: Self-pay | Admitting: Physical Therapy

## 2019-01-06 ENCOUNTER — Other Ambulatory Visit: Payer: Self-pay

## 2019-01-06 ENCOUNTER — Ambulatory Visit (HOSPITAL_COMMUNITY): Payer: Commercial Managed Care - PPO | Admitting: Physical Therapy

## 2019-01-06 DIAGNOSIS — M25662 Stiffness of left knee, not elsewhere classified: Secondary | ICD-10-CM

## 2019-01-06 DIAGNOSIS — M25562 Pain in left knee: Secondary | ICD-10-CM | POA: Diagnosis not present

## 2019-01-06 DIAGNOSIS — R600 Localized edema: Secondary | ICD-10-CM

## 2019-01-06 DIAGNOSIS — M6281 Muscle weakness (generalized): Secondary | ICD-10-CM

## 2019-01-06 NOTE — Therapy (Signed)
[name], [place], [address] Phone: [phone]   Fax:  [phone]  Physical Therapy Treatment / Re-assessment  Patient Details  Name: Marie Douglas MRN: [id number] Date of Birth: 08/14/1960 Referring Provider (PT): Marybelle Killings, MD   Encounter Date: 01/06/2019   Progress Note Reporting Period 12/16/18 to 01/06/19  See note below for Objective Data and Assessment of Progress/Goals.       PT End of Session - 01/06/19 0934    Visit Number  6    Number of Visits  12    Date for PT Re-Evaluation  01/27/19   Minireassess 01/06/19   Authorization Type  [employer]    Authorization Time Period  12/16/18 - 01/27/19    Authorization - Visit Number  6    Authorization - Number of Visits  10    PT Start Time  0930    PT Stop Time  1015    PT Time Calculation (min)  45 min    Equipment Utilized During Treatment  Gait belt    Activity Tolerance  Patient tolerated treatment well;No increased pain    Behavior During Therapy  WFL for tasks assessed/performed       Past Medical History:  Diagnosis Date  . Anxiety   . Depression     History reviewed. No pertinent surgical history.  There were no vitals filed for this visit.  Subjective Assessment - 01/06/19 0932    Subjective  Patient reported that she has been doing her exercises when she can. She denied any pain currently. She presented without a cane today.    Pertinent History  Patellar dislocation 2004 and one in 1977 per patient report    Currently in Pain?  No/denies         [facility] PT Assessment - 01/06/19 0001      Assessment   Medical Diagnosis  Acute pain of left knee, Chondromalcia patella, chondromalacia patellae, Left knee    Referring Provider (PT)  Marybelle Killings, MD    Next MD Visit  01/16/19      Observation/Other Assessments   Focus on Therapeutic Outcomes (FOTO)   44% limited      Circumferential Edema   Circumferential - Right  At joint  line 38 cm    Circumferential - Left   At joint line 38.5 cm      Functional Tests   Functional tests  Squat      Squat   Comments  NBOS knees moving past toes. Patient reporting that she feels like she has no idea how to do a squat.       AROM   Left Knee Extension  0    Left Knee Flexion  133      Strength   Right Hip Flexion  5/5   was 5   Right Hip Extension  4-/5   was 2+   Right Hip ABduction  5/5   was 5   Left Hip Flexion  5/5   was 4-   Left Hip Extension  4-/5   was 2-   Left Hip ABduction  4-/5   was 2+   Right Knee Flexion  5/5   was 5   Right Knee Extension  5/5   was 5   Left Knee Flexion  4+/5   was 3-   Left Knee Extension  4+/5   was 2+   Right Ankle Dorsiflexion  5/5  was 5   Left Ankle Dorsiflexion  5/5   was 5     Palpation   Palpation comment  No apprehension with patella mobility this session      Transfers   Five time sit to stand comments   18 seconds from standard height chair      Ambulation/Gait   Ambulation/Gait  Yes    Ambulation Distance (Feet)  395 Feet   2MWT   Assistive device  None    Gait Pattern  Step-through pattern    Gait velocity  1.0 m/s    Stairs  Yes    Stair Management Technique  One rail Left;Alternating pattern    Number of Stairs  4    Height of Stairs  7      Static Standing Balance   Static Standing - Balance Support  No upper extremity supported    Static Standing Balance -  Activities   Single Leg Stance - Right Leg;Single Leg Stance - Left Leg    Static Standing - Comment/# of Minutes  Right 3 seconds; Lt 4 seconds                    [facility] Adult PT Treatment/Exercise - 01/06/19 0001      Knee/Hip Exercises: Standing   Heel Raises  15 reps    Forward Lunges  Right;Left;1 set;10 reps    Lateral Step Up  Left;Step Height: 4";Hand Hold: 1;15 reps    Forward Step Up  Left;15 reps;Hand Hold: 0;Step Height: 6";Right    Functional Squat  2 sets;10 reps    Functional Squat Limitations  With  verbal cues and demonstration. Chair behind for cue    SLS with Vectors  Forward, side, back 5'' x 5 each LE 1 UE assist      Knee/Hip Exercises: Seated   Sit to Sand  1 set;10 reps   VCs for form              PT Short Term Goals - 01/06/19 1019      PT SHORT TERM GOAL #1   Title  Patient will report understanding and regular compliance with HEP to improve strength, ROM, and overall functional mobility.     Time  3    Period  Weeks    Status  Achieved    Target Date  01/06/19      PT SHORT TERM GOAL #2   Title  Patient will demonstrate left knee AROM from 5 extension to 105 degrees flexion in order to improve gait mechanics and safety with ambulation.     Time  3    Period  Weeks    Status  Achieved    Target Date  01/06/19      PT SHORT TERM GOAL #3   Title  Patient will demonstrate improvement of at least 1/2 MMT strength grade in deficient muscle groups of the left lower extremity to improve gait mechanics and safety during ambulation.    Time  3    Period  Weeks    Status  Achieved        PT Long Term Goals - 01/06/19 1019      PT LONG TERM GOAL #1   Title  Patient will demonstrate left knee AROM from 0 extension to at least 120 degrees flexion in order to improve gait mechanics and safety with ambulation.     Time  6    Period  Weeks    Status  Achieved      PT LONG TERM GOAL #2   Title  Patient will demonstrate improvement of at least 1 MMT strength grade in deficient muscle groups of the left lower extremity to improve gait mechanics and ability to get into and out of a vehicle with greater ease.     Time  6    Period  Weeks    Status  Achieved      PT LONG TERM GOAL #3   Title  Patient will demonstrate ability to maintain SLS for at least 5 seconds on the left lower extremity to improve safety with stair negotiation.    Baseline  01/06/19: see objective measures    Time  6    Period  Weeks    Status  On-going      PT LONG TERM GOAL #4   Title   Patient will demonstrate ability to ambulate at a rate of at least 0.8 m/s on the 2MWT indicating improved safety and mechanics with ambulation to walk at home and in the community.     Time  6    Period  Weeks    Status  Achieved      PT LONG TERM GOAL #5   Title  Patient will report understanding, demonstrate understanding without cueing, and report no more than minimal difficulty with squatting.    Time  3    Period  Weeks    Target Date  01/27/19      Additional Long Term Goals   Additional Long Term Goals  Yes      PT LONG TERM GOAL #6   Title  Patient will perform the 5xSTS test in 14 seconds or less indicating improved functional strength and balance.    Baseline  01/06/19: Patient performed in 18 seconds    Time  3    Period  Weeks    Status  New    Target Date  01/27/19            Plan - 01/06/19 1031    Clinical Impression Statement  This session performed re-assessment of patient's progress towards goals. Patient achieved all short term goals demonstrating good ROM and improvmeents in strength. Patient continued to demonstrate deficits in balance, functional strength such as on the 5xSTS test, and with squatting, as well as with balance. Plan to continue to focus on functional strengthening and balance activities. This session educated patient on proper squatting technique and focused on balance activities. Patient would benefit from continued skilled physical therapy in order to continue progressing towards functional goals.    Personal Factors and Comorbidities  Comorbidity 2    Comorbidities  Depression, anxiety    Examination-Activity Limitations  Bathing;Stairs;Stand;Bend;Lift;Locomotion Level;Squat    Examination-Participation Restrictions  Yard Work;Cleaning;Laundry;Community Activity;Shop;Driving;Volunteer    Stability/Clinical Decision Making  Evolving/Moderate complexity    Rehab Potential  Good    PT Frequency  2x / week    PT Duration  6 weeks    PT  Treatment/Interventions  ADLs/Self Care Home Management;Cryotherapy;Electrical Stimulation;Moist Heat;DME Instruction;Gait training;Stair training;Functional mobility training;Therapeutic activities;Therapeutic exercise;Balance training;Neuromuscular re-education;Patient/family education;Orthotic Fit/Training;Manual techniques;Passive range of motion;Dry needling;Energy conservation;Splinting;Taping    PT Next Visit Plan  Focus on functional strengthening, balance, gait outdoors, squatting    PT Home Exercise Plan  Evaluation: Quad set 1 x 10 5'' holds' 12/20/18 focus on gait mechanics with crutches;  SLR/LAQ and ambulation wiht 1 crutch; 12/30/18: Sidelying hip abduction 2x15 3-4 x/week; 1 set of SLR with ER  Consulted and Agree with Plan of Care  Patient       Patient will benefit from skilled therapeutic intervention in order to improve the following deficits and impairments:  Abnormal gait, Decreased balance, Decreased endurance, Decreased mobility, Difficulty walking, Decreased range of motion, Increased edema, Decreased activity tolerance, Decreased strength, Pain  Visit Diagnosis: 1. Acute pain of left knee   2. Stiffness of left knee, not elsewhere classified   3. Muscle weakness (generalized)   4. Localized edema        Problem List There are no active problems to display for this patient.    Clarene Critchley PT, DPT 10:32 AM, 01/06/19 [place], [place], [address] Phone: [phone]   Fax:  [phone]  Name: Lin Hackmann MRN: [id number] Date of Birth: [birth year]

## 2019-01-10 ENCOUNTER — Other Ambulatory Visit: Payer: Self-pay

## 2019-01-10 ENCOUNTER — Encounter (HOSPITAL_COMMUNITY): Payer: Self-pay | Admitting: Physical Therapy

## 2019-01-10 ENCOUNTER — Ambulatory Visit (HOSPITAL_COMMUNITY): Payer: Commercial Managed Care - PPO | Admitting: Physical Therapy

## 2019-01-10 DIAGNOSIS — M25662 Stiffness of left knee, not elsewhere classified: Secondary | ICD-10-CM

## 2019-01-10 DIAGNOSIS — M25562 Pain in left knee: Secondary | ICD-10-CM | POA: Diagnosis not present

## 2019-01-10 DIAGNOSIS — M6281 Muscle weakness (generalized): Secondary | ICD-10-CM

## 2019-01-10 DIAGNOSIS — R600 Localized edema: Secondary | ICD-10-CM

## 2019-01-10 NOTE — Therapy (Signed)
[place], [place], [address] Phone: [phone]   Fax:  [phone]  Physical Therapy Treatment  Patient Details  Name: Marie Douglas MRN: [id number] Date of Birth: [birth year] Referring Provider (PT): Marybelle Killings, MD   Encounter Date: 01/10/2019  PT End of Session - 01/10/19 0943    Visit Number  7    Number of Visits  12    Date for PT Re-Evaluation  01/27/19   Minireassess 01/06/19   Authorization Type  [employer]    Authorization Time Period  12/16/18 - 01/27/19    Authorization - Visit Number  1    Authorization - Number of Visits  10    PT Start Time  0935    PT Stop Time  1013    PT Time Calculation (min)  38 min    Equipment Utilized During Treatment  Gait belt    Activity Tolerance  Patient tolerated treatment well;No increased pain    Behavior During Therapy  WFL for tasks assessed/performed       Past Medical History:  Diagnosis Date  . Anxiety   . Depression     History reviewed. No pertinent surgical history.  There were no vitals filed for this visit.  Subjective Assessment - 01/10/19 0936    Subjective  Patient reported she had some soreness this morning, but denied pain currently.    Pertinent History  Patellar dislocation 2004 and one in 1977 per patient report    Currently in Pain?  No/denies                       [facility] Adult PT Treatment/Exercise - 01/10/19 0001      Knee/Hip Exercises: Standing   Heel Raises  15 reps    Forward Lunges  Right;Left;1 set;10 reps    Side Lunges  Left;1 set;15 reps;Right    Lateral Step Up  Left;15 reps;Hand Hold: 0;Step Height: 6"    Forward Step Up  Left;15 reps;Hand Hold: 0;Step Height: 6";Right    Functional Squat  2 sets;10 reps    Functional Squat Limitations  With verbal cues and demonstration. Chair behind for cue    Wall Squat  10 reps;3 seconds    Wall Squat Limitations  ball squeeze    SLS with Vectors  Forward,  side, back 5'' x 5 each LE 1 UE assist    Other Standing Knee Exercises  Sidestepping with RTB around bilateral thighs 15' x 2 RT      Knee/Hip Exercises: Seated   Sit to Sand  2 sets;10 reps   VCs for form. 2nd set with left foot staggered behind right     Knee/Hip Exercises: Supine   Short Arc Quad Sets  15 reps;Strengthening    Short Arc Quad Sets Limitations  2# ankle weight    Bridges  Strengthening;Both;15 reps    Bridges Limitations  with ball squeeze    Straight Leg Raises  2 sets;10 reps;Strengthening    Straight Leg Raises Limitations  1st set neutral rotation, second set ER.       Knee/Hip Exercises: Sidelying   Other Sidelying Knee/Hip Exercises  Clams with RTB 2 x 15                PT Short Term Goals - 01/06/19 1019      PT SHORT TERM GOAL #1   Title  Patient will report understanding and regular compliance with  HEP to improve strength, ROM, and overall functional mobility.     Time  3    Period  Weeks    Status  Achieved    Target Date  01/06/19      PT SHORT TERM GOAL #2   Title  Patient will demonstrate left knee AROM from 5 extension to 105 degrees flexion in order to improve gait mechanics and safety with ambulation.     Time  3    Period  Weeks    Status  Achieved    Target Date  01/06/19      PT SHORT TERM GOAL #3   Title  Patient will demonstrate improvement of at least 1/2 MMT strength grade in deficient muscle groups of the left lower extremity to improve gait mechanics and safety during ambulation.    Time  3    Period  Weeks    Status  Achieved        PT Long Term Goals - 01/06/19 1019      PT LONG TERM GOAL #1   Title  Patient will demonstrate left knee AROM from 0 extension to at least 120 degrees flexion in order to improve gait mechanics and safety with ambulation.     Time  6    Period  Weeks    Status  Achieved      PT LONG TERM GOAL #2   Title  Patient will demonstrate improvement of at least 1 MMT strength grade in  deficient muscle groups of the left lower extremity to improve gait mechanics and ability to get into and out of a vehicle with greater ease.     Time  6    Period  Weeks    Status  Achieved      PT LONG TERM GOAL #3   Title  Patient will demonstrate ability to maintain SLS for at least 5 seconds on the left lower extremity to improve safety with stair negotiation.    Baseline  01/06/19: see objective measures    Time  6    Period  Weeks    Status  On-going      PT LONG TERM GOAL #4   Title  Patient will demonstrate ability to ambulate at a rate of at least 0.8 m/s on the 2MWT indicating improved safety and mechanics with ambulation to walk at home and in the community.     Time  6    Period  Weeks    Status  Achieved      PT LONG TERM GOAL #5   Title  Patient will report understanding, demonstrate understanding without cueing, and report no more than minimal difficulty with squatting.    Time  3    Period  Weeks    Target Date  01/27/19      Additional Long Term Goals   Additional Long Term Goals  Yes      PT LONG TERM GOAL #6   Title  Patient will perform the 5xSTS test in 14 seconds or less indicating improved functional strength and balance.    Baseline  01/06/19: Patient performed in 18 seconds    Time  3    Period  Weeks    Status  New    Target Date  01/27/19            Plan - 01/10/19 1007    Clinical Impression Statement  This session continued with a focus on strengthening and balancing exercises. This session added  side lunges to improve lower extremity control and strengthening. Continued with functional strengthening in addition to targeted strengthening for quadriceps. This session added staggered sit to stand from standard height chair. Patient required intermittent cues for proper form with exercises throughout.    Personal Factors and Comorbidities  Comorbidity 2    Comorbidities  Depression, anxiety    Examination-Activity Limitations   Bathing;Stairs;Stand;Bend;Lift;Locomotion Level;Squat    Examination-Participation Restrictions  Yard Work;Cleaning;Laundry;Community Activity;Shop;Driving;Volunteer    Stability/Clinical Decision Making  Evolving/Moderate complexity    Rehab Potential  Good    PT Frequency  2x / week    PT Duration  6 weeks    PT Treatment/Interventions  ADLs/Self Care Home Management;Cryotherapy;Electrical Stimulation;Moist Heat;DME Instruction;Gait training;Stair training;Functional mobility training;Therapeutic activities;Therapeutic exercise;Balance training;Neuromuscular re-education;Patient/family education;Orthotic Fit/Training;Manual techniques;Passive range of motion;Dry needling;Energy conservation;Splinting;Taping    PT Next Visit Plan  Focus on functional strengthening, balance, gait outdoors, squatting    PT Home Exercise Plan  Evaluation: Quad set 1 x 10 5'' holds' 12/20/18 focus on gait mechanics with crutches;  SLR/LAQ and ambulation wiht 1 crutch; 12/30/18: Sidelying hip abduction 2x15 3-4 x/week; 1 set of SLR with ER    Consulted and Agree with Plan of Care  Patient       Patient will benefit from skilled therapeutic intervention in order to improve the following deficits and impairments:  Abnormal gait, Decreased balance, Decreased endurance, Decreased mobility, Difficulty walking, Decreased range of motion, Increased edema, Decreased activity tolerance, Decreased strength, Pain  Visit Diagnosis: 1. Acute pain of left knee   2. Stiffness of left knee, not elsewhere classified   3. Muscle weakness (generalized)   4. Localized edema        Problem List There are no active problems to display for this patient.   Clarene Critchley 01/10/2019, 10:15 AM  [place], [place], [address] Phone: [phone]   Fax:  [phone]  Name: Nhyira Leano MRN: [id number] Date of Birth: 08-Mar-1961

## 2019-01-13 ENCOUNTER — Other Ambulatory Visit: Payer: Self-pay

## 2019-01-13 ENCOUNTER — Ambulatory Visit (HOSPITAL_COMMUNITY): Payer: Commercial Managed Care - PPO | Admitting: Physical Therapy

## 2019-01-13 ENCOUNTER — Encounter (HOSPITAL_COMMUNITY): Payer: Self-pay | Admitting: Physical Therapy

## 2019-01-13 DIAGNOSIS — M6281 Muscle weakness (generalized): Secondary | ICD-10-CM

## 2019-01-13 DIAGNOSIS — M25562 Pain in left knee: Secondary | ICD-10-CM | POA: Diagnosis not present

## 2019-01-13 DIAGNOSIS — M25662 Stiffness of left knee, not elsewhere classified: Secondary | ICD-10-CM

## 2019-01-13 DIAGNOSIS — R600 Localized edema: Secondary | ICD-10-CM

## 2019-01-13 NOTE — Therapy (Signed)
[place], [place], [address] Phone: [phone]   Fax:  [phone]  Physical Therapy Treatment  Patient Details  Name: Marie Douglas MRN: [id number] Date of Birth: 04-08-1961 Referring Provider (PT): Marybelle Killings, MD   Encounter Date: 01/13/2019  PT End of Session - 01/13/19 0943    Visit Number  8    Number of Visits  12    Date for PT Re-Evaluation  01/27/19   Minireassess 01/06/19   Authorization Type  [place] Time Period  12/16/18 - 01/27/19    Authorization - Visit Number  2    Authorization - Number of Visits  10    PT Start Time  [phone]    PT Stop Time  1016    PT Time Calculation (min)  39 min    Equipment Utilized During Treatment  Gait belt    Activity Tolerance  Patient tolerated treatment well;No increased pain    Behavior During Therapy  WFL for tasks assessed/performed       Past Medical History:  Diagnosis Date  . Anxiety   . Depression     History reviewed. No pertinent surgical history.  There were no vitals filed for this visit.  Subjective Assessment - 01/13/19 0940    Subjective  Patient denied any pain currently.    Pertinent History  Patellar dislocation 2004 and one in 1977 per patient report    Currently in Pain?  No/denies                       [facility] Adult PT Treatment/Exercise - 01/13/19 0001      Knee/Hip Exercises: Stretches   Passive Hamstring Stretch  Right;Left;3 reps;30 seconds    Passive Hamstring Stretch Limitations  12'' step    Gastroc Stretch  Right;Left;3 reps;30 seconds    Gastroc Stretch Limitations  Slant board      Knee/Hip Exercises: Standing   Forward Lunges  Right;Left;1 set;15 reps    Side Lunges  Left;1 set;Right;15 reps    Lateral Step Up  Left;15 reps;Hand Hold: 0;Step Height: 6"    Forward Step Up  Left;15 reps;Hand Hold: 0;Step Height: 6";Right    Functional Squat  2 sets;10 reps    Functional Squat  Limitations  With verbal cues and demonstration. Chair behind for cue    Wall Squat  10 reps;3 seconds    Wall Squat Limitations  ball squeeze    SLS with Vectors  Forward, side, back 5'' x 5 each LE 1 UE assist    Gait Training  Outdoor gait training x 5 minutes without AD    Other Standing Knee Exercises  Sidestepping with RTB around bilateral thighs 15' x 3 RT      Knee/Hip Exercises: Seated   Sit to Sand  2 sets;10 reps   2nd set with left LE staggered behind right     Knee/Hip Exercises: Supine   Short Arc Quad Sets  15 reps;Strengthening    Short Arc Quad Sets Limitations  2# ankle weight    Bridges  Strengthening;Both;15 reps    Straight Leg Raises  2 sets;10 reps;Strengthening    Straight Leg Raises Limitations  1st set neutral rotation, second set ER.                PT Short Term Goals - 01/06/19 1019      PT SHORT TERM GOAL #1  Title  Patient will report understanding and regular compliance with HEP to improve strength, ROM, and overall functional mobility.     Time  3    Period  Weeks    Status  Achieved    Target Date  01/06/19      PT SHORT TERM GOAL #2   Title  Patient will demonstrate left knee AROM from 5 extension to 105 degrees flexion in order to improve gait mechanics and safety with ambulation.     Time  3    Period  Weeks    Status  Achieved    Target Date  01/06/19      PT SHORT TERM GOAL #3   Title  Patient will demonstrate improvement of at least 1/2 MMT strength grade in deficient muscle groups of the left lower extremity to improve gait mechanics and safety during ambulation.    Time  3    Period  Weeks    Status  Achieved        PT Long Term Goals - 01/06/19 1019      PT LONG TERM GOAL #1   Title  Patient will demonstrate left knee AROM from 0 extension to at least 120 degrees flexion in order to improve gait mechanics and safety with ambulation.     Time  6    Period  Weeks    Status  Achieved      PT LONG TERM GOAL #2    Title  Patient will demonstrate improvement of at least 1 MMT strength grade in deficient muscle groups of the left lower extremity to improve gait mechanics and ability to get into and out of a vehicle with greater ease.     Time  6    Period  Weeks    Status  Achieved      PT LONG TERM GOAL #3   Title  Patient will demonstrate ability to maintain SLS for at least 5 seconds on the left lower extremity to improve safety with stair negotiation.    Baseline  01/06/19: see objective measures    Time  6    Period  Weeks    Status  On-going      PT LONG TERM GOAL #4   Title  Patient will demonstrate ability to ambulate at a rate of at least 0.8 m/s on the 2MWT indicating improved safety and mechanics with ambulation to walk at home and in the community.     Time  6    Period  Weeks    Status  Achieved      PT LONG TERM GOAL #5   Title  Patient will report understanding, demonstrate understanding without cueing, and report no more than minimal difficulty with squatting.    Time  3    Period  Weeks    Target Date  01/27/19      Additional Long Term Goals   Additional Long Term Goals  Yes      PT LONG TERM GOAL #6   Title  Patient will perform the 5xSTS test in 14 seconds or less indicating improved functional strength and balance.    Baseline  01/06/19: Patient performed in 18 seconds    Time  3    Period  Weeks    Status  New    Target Date  01/27/19            Plan - 01/13/19 1028    Clinical Impression Statement  Continued with established plan  of care this session. Added outdoor ambulation this session without an assistive device to challenge gait training further. Patient with some reports of discomfort when performing squatting activities, but it dissipated with increased repetitions. Plan to continue progressing patient with strengthening and balance activities to improve overall functional mobility.    Personal Factors and Comorbidities  Comorbidity 2    Comorbidities   Depression, anxiety    Examination-Activity Limitations  Bathing;Stairs;Stand;Bend;Lift;Locomotion Level;Squat    Examination-Participation Restrictions  Yard Work;Cleaning;Laundry;Community Activity;Shop;Driving;Volunteer    Stability/Clinical Decision Making  Evolving/Moderate complexity    Rehab Potential  Good    PT Frequency  2x / week    PT Duration  6 weeks    PT Treatment/Interventions  ADLs/Self Care Home Management;Cryotherapy;Electrical Stimulation;Moist Heat;DME Instruction;Gait training;Stair training;Functional mobility training;Therapeutic activities;Therapeutic exercise;Balance training;Neuromuscular re-education;Patient/family education;Orthotic Fit/Training;Manual techniques;Passive range of motion;Dry needling;Energy conservation;Splinting;Taping    PT Next Visit Plan  Focus on functional strengthening, balance, gait outdoors, squatting. Consider adding leg press next visit    PT Home Exercise Plan  Evaluation: Quad set 1 x 10 5'' holds' 12/20/18 focus on gait mechanics with crutches;  SLR/LAQ and ambulation wiht 1 crutch; 12/30/18: Sidelying hip abduction 2x15 3-4 x/week; 1 set of SLR with ER    Consulted and Agree with Plan of Care  Patient       Patient will benefit from skilled therapeutic intervention in order to improve the following deficits and impairments:  Abnormal gait, Decreased balance, Decreased endurance, Decreased mobility, Difficulty walking, Decreased range of motion, Increased edema, Decreased activity tolerance, Decreased strength, Pain  Visit Diagnosis: 1. Acute pain of left knee   2. Stiffness of left knee, not elsewhere classified   3. Muscle weakness (generalized)   4. Localized edema        Problem List There are no active problems to display for this patient.  Clarene Critchley PT, DPT 10:29 AM, 01/13/19 [place], [place], [address] Phone: [phone]   Fax:   [phone]  Name: Marie Douglas MRN: [id number] Date of Birth: Jun 27, 1961

## 2019-01-16 ENCOUNTER — Encounter: Payer: Self-pay | Admitting: Orthopaedic Surgery

## 2019-01-16 ENCOUNTER — Other Ambulatory Visit: Payer: Self-pay

## 2019-01-16 ENCOUNTER — Ambulatory Visit: Payer: Commercial Managed Care - PPO | Admitting: Orthopaedic Surgery

## 2019-01-16 ENCOUNTER — Ambulatory Visit (INDEPENDENT_AMBULATORY_CARE_PROVIDER_SITE_OTHER): Payer: Commercial Managed Care - PPO | Admitting: Orthopaedic Surgery

## 2019-01-16 ENCOUNTER — Encounter (HOSPITAL_COMMUNITY): Payer: Commercial Managed Care - PPO | Admitting: Physical Therapy

## 2019-01-16 DIAGNOSIS — M2242 Chondromalacia patellae, left knee: Secondary | ICD-10-CM

## 2019-01-16 NOTE — Progress Notes (Signed)
Office Visit Note   Patient: Marie Douglas           Date of Birth: 12-07-1960           MRN: [id number] Visit Date: 01/16/2019              Requested by: No referring provider defined for this encounter. PCP: System, Pcp Not In   Assessment & Plan: Visit Diagnoses:  1. Chondromalacia patellae, left knee     Plan: Chondromalacia patella with some quad weakness therapy is improved her activity level and helped her pain significantly.  She will finish out her therapy and I gave her some additional home exercises she can do after she finishes therapy to maintain quad strength.  She has increased problems she can return.  Follow-Up Instructions: Return if symptoms worsen or fail to improve.   Orders:  No orders of the defined types were placed in this encounter.  No orders of the defined types were placed in this encounter.     Procedures: No procedures performed   Clinical Data: No additional findings.   Subjective: Chief Complaint  Patient presents with  . Left Knee - Follow-up    HPI 58-year-old female returns for follow-up of chondromalacia patella 5 weeks after previous visit.  She has been going to therapy and states she is gotten excellent response with therapy.  She still has occasional discomfort with certain movements.  She has been compliant and still has a few more visits remaining.  Review of Systems updated unchanged from 12/12/2018 office visit other than as mentioned in HPI.   Objective: Vital Signs: BP (!) 162/100   Pulse (!) 118   Ht 5\' 8"  (1.727 m)   Wt 195 lb (88.5 kg)   BMI 29.65 kg/m   Physical Exam Constitutional:      Appearance: She is well-developed.  HENT:     Head: Normocephalic.     Right Ear: External ear normal.     Left Ear: External ear normal.  Eyes:     Pupils: Pupils are equal, round, and reactive to light.  Neck:     Thyroid: No thyromegaly.     Trachea: No tracheal deviation.  Cardiovascular:     Rate and Rhythm: Normal  rate.  Pulmonary:     Effort: Pulmonary effort is normal.  Abdominal:     Palpations: Abdomen is soft.  Skin:    General: Skin is warm and dry.  Neurological:     Mental Status: She is alert and oriented to person, place, and time.  Psychiatric:        Behavior: Behavior normal.     Ortho Exam patient has no knee effusion right and left full extension good flexion normal hip range of motion.  When she gets up on a single step using her left leg first she still hops quickly and leans forward at the hips.  Opposite right knee is better but she still has some hip flexion but does not use her hands on the exam table help push herself into full extension upright.  Collateral ligaments are stable.  Specialty Comments:  No specialty comments available.  Imaging: No results found.   PMFS History: Patient Active Problem List   Diagnosis Date Noted  . Chondromalacia patellae, left knee 01/16/2019   Past Medical History:  Diagnosis Date  . Anxiety   . Depression     No family history on file.  No past surgical history on file. Social History  Occupational History  . Not on file  Tobacco Use  . Smoking status: Former Smoker    Packs/day: 1.00    Years: 10.00    Pack years: 10.00    Types: Cigarettes  . Smokeless tobacco: Never Used  Substance and Sexual Activity  . Alcohol use: Not Currently  . Drug use: Not on file  . Sexual activity: Not on file

## 2019-01-20 ENCOUNTER — Ambulatory Visit (HOSPITAL_COMMUNITY): Payer: Commercial Managed Care - PPO | Attending: Orthopaedic Surgery | Admitting: Physical Therapy

## 2019-01-20 ENCOUNTER — Encounter (HOSPITAL_COMMUNITY): Payer: Self-pay | Admitting: Physical Therapy

## 2019-01-20 ENCOUNTER — Other Ambulatory Visit: Payer: Self-pay

## 2019-01-20 DIAGNOSIS — M25662 Stiffness of left knee, not elsewhere classified: Secondary | ICD-10-CM | POA: Insufficient documentation

## 2019-01-20 DIAGNOSIS — M25562 Pain in left knee: Secondary | ICD-10-CM | POA: Insufficient documentation

## 2019-01-20 DIAGNOSIS — M6281 Muscle weakness (generalized): Secondary | ICD-10-CM | POA: Insufficient documentation

## 2019-01-20 DIAGNOSIS — R600 Localized edema: Secondary | ICD-10-CM | POA: Diagnosis present

## 2019-01-20 NOTE — Therapy (Signed)
[place], [place], [address] Phone: [phone]   Fax:  [phone]  Physical Therapy Treatment  Patient Details  Name: Marie Douglas MRN: [id number] Date of Birth: 1961/04/30 Referring Provider (PT): Marybelle Killings, MD   Encounter Date: 01/20/2019  PT End of Session - 01/20/19 0943    Visit Number  9   PN completed visit 6   Number of Visits  12    Date for PT Re-Evaluation  01/27/19   Minireassess 01/06/19   Authorization Type  [facility]; PN completed visit #6    Authorization Time Period  12/16/18 - 01/27/19    Authorization - Visit Number  3    Authorization - Number of Visits  10    PT Start Time  [phone]    PT Stop Time  0920    PT Time Calculation (min)  47 min    Equipment Utilized During Treatment  Gait belt    Activity Tolerance  Patient tolerated treatment well;No increased pain    Behavior During Therapy  WFL for tasks assessed/performed       Past Medical History:  Diagnosis Date  . Anxiety   . Depression     History reviewed. No pertinent surgical history.  There were no vitals filed for this visit.  Subjective Assessment - 01/20/19 0840    Subjective  pt states she is doing well today, currently wtihout pain or issues.  STates she returned to orthopedist and he discharged her.    Currently in Pain?  No/denies                       [facility] Adult PT Treatment/Exercise - 01/20/19 0001      Knee/Hip Exercises: Stretches   Passive Hamstring Stretch  Right;Left;3 reps;30 seconds    Passive Hamstring Stretch Limitations  12'' step    Gastroc Stretch  Right;Left;3 reps;30 seconds    Gastroc Stretch Limitations  Slant board      Knee/Hip Exercises: Standing   Heel Raises  15 reps    Forward Lunges  Right;Left;1 set;15 reps    Side Lunges  Left;1 set;Right;15 reps    Lateral Step Up  Left;15 reps;Hand Hold: 0;Step Height: 6"    Lateral Step Up Limitations  cueing for  mechanics    Forward Step Up  Left;15 reps;Hand Hold: 0;Step Height: 6";Right    Functional Squat  2 sets;15 reps    Functional Squat Limitations  With verbal cues and demonstration. Chair behind for cue    Stairs  7" steps with 1 HR 3RT reciprocally    SLS with Vectors  Forward, side, back 5'' x 5 each LE 1 UE assist      Knee/Hip Exercises: Seated   Sit to Sand  2 sets;10 reps             PT Education - 01/20/19 0945    Education Details  pt with questions regarding benefits of 3 machines for home; 60 UP, scoop and Cubii. Informed patient therapist was not familiar with these machines but wouldlook into them. pt verbalized understanding.  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       PT Short Term Goals - 01/06/19 1019      PT SHORT TERM GOAL #1   Title  Patient will report understanding and regular compliance with HEP to improve strength, ROM, and overall functional mobility.  Time  3    Period  Weeks    Status  Achieved    Target Date  01/06/19      PT SHORT TERM GOAL #2   Title  Patient will demonstrate left knee AROM from 5 extension to 105 degrees flexion in order to improve gait mechanics and safety with ambulation.     Time  3    Period  Weeks    Status  Achieved    Target Date  01/06/19      PT SHORT TERM GOAL #3   Title  Patient will demonstrate improvement of at least 1/2 MMT strength grade in deficient muscle groups of the left lower extremity to improve gait mechanics and safety during ambulation.    Time  3    Period  Weeks    Status  Achieved        PT Long Term Goals - 01/06/19 1019      PT LONG TERM GOAL #1   Title  Patient will demonstrate left knee AROM from 0 extension to at least 120 degrees flexion in order to improve gait mechanics and safety with ambulation.     Time  6    Period  Weeks    Status  Achieved      PT LONG TERM GOAL #2   Title  Patient will demonstrate improvement of at least 1 MMT strength  grade in deficient muscle groups of the left lower extremity to improve gait mechanics and ability to get into and out of a vehicle with greater ease.     Time  6    Period  Weeks    Status  Achieved      PT LONG TERM GOAL #3   Title  Patient will demonstrate ability to maintain SLS for at least 5 seconds on the left lower extremity to improve safety with stair negotiation.    Baseline  01/06/19: see objective measures    Time  6    Period  Weeks    Status  On-going      PT LONG TERM GOAL #4   Title  Patient will demonstrate ability to ambulate at a rate of at least 0.8 m/s on the 2MWT indicating improved safety and mechanics with ambulation to walk at home and in the community.     Time  6    Period  Weeks    Status  Achieved      PT LONG TERM GOAL #5   Title  Patient will report understanding, demonstrate understanding without cueing, and report no more than minimal difficulty with squatting.    Time  3    Period  Weeks    Target Date  01/27/19      Additional Long Term Goals   Additional Long Term Goals  Yes      PT LONG TERM GOAL #6   Title  Patient will perform the 5xSTS test in 14 seconds or less indicating improved functional strength and balance.    Baseline  01/06/19: Patient performed in 18 seconds    Time  3    Period  Weeks    Status  New    Target Date  01/27/19            Plan - 01/20/19 0948    Clinical Impression Statement  continued with establishe POC, continues to need halp with completing squats in correct form.  Pt also requires cues with count and being accountable for keeping up  with reps/count herself.   Pt with quesitons regarding 3 different types of exericse machines that therapist was not familiar with; explained would research and get back to her.  Pt has also reached out to evaluating therapist via email.   Added stairs this session with initial "hop up" with left when ascending but able to discontiue in reamining repetititions.  Pt with concerns  regarding abiltiy to lift hay from wet bin.  Able to instruct how to do this using correct body mechanics and how to problem solve if she has pain or issues. also instructed to discontinue until she is stronger if she is still unable to lift this without pain.    Personal Factors and Comorbidities  Comorbidity 2    Comorbidities  Depression, anxiety    Examination-Activity Limitations  Bathing;Stairs;Stand;Bend;Lift;Locomotion Level;Squat    Examination-Participation Restrictions  Yard Work;Cleaning;Laundry;Community Activity;Shop;Driving;Volunteer    Stability/Clinical Decision Making  Evolving/Moderate complexity    Rehab Potential  Good    PT Frequency  2x / week    PT Duration  6 weeks    PT Treatment/Interventions  ADLs/Self Care Home Management;Cryotherapy;Electrical Stimulation;Moist Heat;DME Instruction;Gait training;Stair training;Functional mobility training;Therapeutic activities;Therapeutic exercise;Balance training;Neuromuscular re-education;Patient/family education;Orthotic Fit/Training;Manual techniques;Passive range of motion;Dry needling;Energy conservation;Splinting;Taping    PT Next Visit Plan  Focus on functional strengthening, balance, gait outdoors, squatting. Consider adding leg press next visit.    PT Home Exercise Plan  Evaluation: Quad set 1 x 10 5'' holds' 12/20/18 focus on gait mechanics with crutches;  SLR/LAQ and ambulation wiht 1 crutch; 12/30/18: Sidelying hip abduction 2x15 3-4 x/week; 1 set of SLR with ER    Consulted and Agree with Plan of Care  Patient       Patient will benefit from skilled therapeutic intervention in order to improve the following deficits and impairments:  Abnormal gait, Decreased balance, Decreased endurance, Decreased mobility, Difficulty walking, Decreased range of motion, Increased edema, Decreased activity tolerance, Decreased strength, Pain  Visit Diagnosis: 1. Acute pain of left knee   2. Stiffness of left knee, not elsewhere classified    3. Muscle weakness (generalized)   4. Localized edema        Problem List Patient Active Problem List   Diagnosis Date Noted  . Chondromalacia patellae, left knee 01/16/2019   Teena Irani, PTA/CLT [phone]  Teena Irani 01/20/2019, 9:59 AM  [place], [place], [address] Phone: [phone]   Fax:  [phone]  Name: Taji Sather MRN: [id number] Date of Birth: Jan 24, 1961

## 2019-01-24 ENCOUNTER — Other Ambulatory Visit: Payer: Self-pay

## 2019-01-24 ENCOUNTER — Ambulatory Visit (HOSPITAL_COMMUNITY): Payer: Commercial Managed Care - PPO | Admitting: Physical Therapy

## 2019-01-24 ENCOUNTER — Encounter (HOSPITAL_COMMUNITY): Payer: Self-pay | Admitting: Physical Therapy

## 2019-01-24 DIAGNOSIS — M25562 Pain in left knee: Secondary | ICD-10-CM | POA: Diagnosis not present

## 2019-01-24 DIAGNOSIS — R600 Localized edema: Secondary | ICD-10-CM

## 2019-01-24 DIAGNOSIS — M25662 Stiffness of left knee, not elsewhere classified: Secondary | ICD-10-CM

## 2019-01-24 DIAGNOSIS — M6281 Muscle weakness (generalized): Secondary | ICD-10-CM

## 2019-01-24 NOTE — Therapy (Signed)
[place], [place], [address] Phone: [phone]   Fax:  [phone]  Physical Therapy Treatment  Patient Details  Name: Marie Douglas MRN: [id number] Date of Birth: Feb 02, 1961 Referring Provider (PT): Marybelle Killings, MD   Encounter Date: 01/24/2019   Progress Note Reporting Period 01/06/19 to 01/24/19  See note below for Objective Data and Assessment of Progress/Goals.       PT End of Session - 01/24/19 1025    Visit Number  10   PN completed visit 6   Number of Visits  12    Date for PT Re-Evaluation  01/27/19   Minireassess 01/06/19   Authorization Type  [facility]; PN completed visit #6    Authorization Time Period  12/16/18 - 01/27/19    Authorization - Visit Number  4    Authorization - Number of Visits  10    PT Start Time  0936    PT Stop Time  1008    PT Time Calculation (min)  32 min    Activity Tolerance  Patient tolerated treatment well;No increased pain    Behavior During Therapy  WFL for tasks assessed/performed       Past Medical History:  Diagnosis Date  . Anxiety   . Depression     History reviewed. No pertinent surgical history.  There were no vitals filed for this visit.  Subjective Assessment - 01/24/19 1023    Subjective  Patient reported that she is doing well and feels ready to be done with physical therapy for now. She reported that she feels more confident with most activities including squating.    Currently in Pain?  No/denies         [facility] PT Assessment - 01/24/19 0001      Assessment   Medical Diagnosis  Acute pain of left knee, Chondromalcia patella, chondromalacia patellae, Left knee    Referring Provider (PT)  Marybelle Killings, MD    Next MD Visit  01/16/19      Functional Tests   Functional tests  Squat      Squat   Comments  form WFL, minor discomfort reported      Transfers   Five time sit to stand comments   14 seconds from standard height chair      Ambulation/Gait   Ambulation/Gait  Yes    Ambulation Distance (Feet)  472 Feet   2MWT   Assistive device  None    Gait Pattern  Step-through pattern      Static Standing Balance   Static Standing - Balance Support  No upper extremity supported    Static Standing Balance -  Activities   Single Leg Stance - Right Leg;Single Leg Stance - Left Leg    Static Standing - Comment/# of Minutes  Right 25 seconds; Left 12 seconds                           PT Education - 01/24/19 1024    Education Details  Discussed re-assessment findings. Updated HEP. Discussed machines patient had asked about, and that therapist does not feel that patient needs any of these machines, but discussed them with the patient if she does wish to purchase one.    Person(s) Educated  Patient    Methods  Explanation;Handout    Comprehension  Verbalized understanding       PT Short Term Goals - 01/06/19 1019  PT SHORT TERM GOAL #1   Title  Patient will report understanding and regular compliance with HEP to improve strength, ROM, and overall functional mobility.     Time  3    Period  Weeks    Status  Achieved    Target Date  01/06/19      PT SHORT TERM GOAL #2   Title  Patient will demonstrate left knee AROM from 5 extension to 105 degrees flexion in order to improve gait mechanics and safety with ambulation.     Time  3    Period  Weeks    Status  Achieved    Target Date  01/06/19      PT SHORT TERM GOAL #3   Title  Patient will demonstrate improvement of at least 1/2 MMT strength grade in deficient muscle groups of the left lower extremity to improve gait mechanics and safety during ambulation.    Time  3    Period  Weeks    Status  Achieved        PT Long Term Goals - 01/24/19 1028      PT LONG TERM GOAL #1   Title  Patient will demonstrate left knee AROM from 0 extension to at least 120 degrees flexion in order to improve gait mechanics and safety with ambulation.     Time   6    Period  Weeks    Status  Achieved      PT LONG TERM GOAL #2   Title  Patient will demonstrate improvement of at least 1 MMT strength grade in deficient muscle groups of the left lower extremity to improve gait mechanics and ability to get into and out of a vehicle with greater ease.     Time  6    Period  Weeks    Status  Achieved      PT LONG TERM GOAL #3   Title  Patient will demonstrate ability to maintain SLS for at least 5 seconds on the left lower extremity to improve safety with stair negotiation.    Baseline  01/24/19: Achieved    Time  6    Period  Weeks    Status  Achieved      PT LONG TERM GOAL #4   Title  Patient will demonstrate ability to ambulate at a rate of at least 0.8 m/s on the 2MWT indicating improved safety and mechanics with ambulation to walk at home and in the community.     Time  6    Period  Weeks    Status  Achieved      PT LONG TERM GOAL #5   Title  Patient will report understanding, demonstrate understanding without cueing, and report no more than minimal difficulty with squatting.    Time  3    Period  Weeks    Status  Achieved      Additional Long Term Goals   Additional Long Term Goals  Yes      PT LONG TERM GOAL #6   Title  Patient will perform the 5xSTS test in 14 seconds or less indicating improved functional strength and balance.    Baseline  01/24/19: Patient performed in 14 seconds    Time  3    Period  Weeks    Status  Achieved      PT LONG TERM GOAL #7   Title  Patient will report regular independence compliance and understanding with HEP for 1 month and without  increased pain.    Time  4    Period  Weeks    Status  New    Target Date  02/21/19            Plan - 01/24/19 1034    Clinical Impression Statement  Performed a re-assessment of patient's progress towards goals this session as patient reported that she felt ready to be done with physical therapy. Patient had achieved all goals. Added an additional goal to focus  on patient's independence with updated HEP without attendance in physical therapy. Remainder of session discussed with patient an update in her HEP, her progress on re-assessment, and plan to place the patient on hold for one month in order for her to perform exercises on her own at home independently and explained that if she felt good following this month that she did not need to return for more therapy. Patient gave consent to this plan.    Personal Factors and Comorbidities  Comorbidity 2    Comorbidities  Depression, anxiety    Examination-Activity Limitations  Bathing;Stairs;Stand;Bend;Lift;Locomotion Level;Squat    Examination-Participation Restrictions  Yard Work;Cleaning;Laundry;Community Activity;Shop;Driving;Volunteer    Stability/Clinical Decision Making  Evolving/Moderate complexity    Rehab Potential  Good    PT Frequency  2x / week    PT Duration  6 weeks    PT Treatment/Interventions  ADLs/Self Care Home Management;Cryotherapy;Electrical Stimulation;Moist Heat;DME Instruction;Gait training;Stair training;Functional mobility training;Therapeutic activities;Therapeutic exercise;Balance training;Neuromuscular re-education;Patient/family education;Orthotic Fit/Training;Manual techniques;Passive range of motion;Dry needling;Energy conservation;Splinting;Taping    PT Next Visit Plan  1 month with HEP re-assess in 1 month as needed    PT Home Exercise Plan  Evaluation: Quad set 1 x 10 5'' holds' 12/20/18 focus on gait mechanics with crutches;  SLR/LAQ and ambulation wiht 1 crutch; 12/30/18: Sidelying hip abduction 2x15 3-4 x/week; 1 set of SLR with ER. 01/24/19: discontinue quad set; forward lunge x15; squat x 15, SLS vectors 5'' x 5 each LE    Consulted and Agree with Plan of Care  Patient       Patient will benefit from skilled therapeutic intervention in order to improve the following deficits and impairments:  Abnormal gait, Decreased balance, Decreased endurance, Decreased mobility,  Difficulty walking, Decreased range of motion, Increased edema, Decreased activity tolerance, Decreased strength, Pain  Visit Diagnosis: 1. Acute pain of left knee   2. Stiffness of left knee, not elsewhere classified   3. Muscle weakness (generalized)   4. Localized edema        Problem List Patient Active Problem List   Diagnosis Date Noted  . Chondromalacia patellae, left knee 01/16/2019   Clarene Critchley PT, DPT 2:47 PM, 01/24/19 [place] [address] [place], [place], [address] Phone: [phone]   Fax:  [phone]  Name: Marie Douglas MRN: [id number] Date of Birth: 1961-02-03

## 2019-01-24 NOTE — Patient Instructions (Signed)
HEP access code: [email or web address]

## 2019-01-27 ENCOUNTER — Ambulatory Visit (HOSPITAL_COMMUNITY): Payer: Commercial Managed Care - PPO | Admitting: Physical Therapy

## 2019-01-31 ENCOUNTER — Encounter (HOSPITAL_COMMUNITY): Payer: Commercial Managed Care - PPO | Admitting: Physical Therapy

## 2019-02-21 ENCOUNTER — Telehealth (HOSPITAL_COMMUNITY): Payer: Self-pay | Admitting: Physical Therapy

## 2019-02-21 ENCOUNTER — Ambulatory Visit (HOSPITAL_COMMUNITY): Payer: Commercial Managed Care - PPO | Admitting: Physical Therapy

## 2019-02-21 ENCOUNTER — Encounter (HOSPITAL_COMMUNITY): Payer: Self-pay | Admitting: Physical Therapy

## 2019-02-21 NOTE — Telephone Encounter (Signed)
S/w patient she wants to cx this apptment and please d/c today, she is doing well

## 2019-02-21 NOTE — Therapy (Signed)
[place], [place], [address] Phone: [phone]   Fax:  [phone]  Patient Details  Name: Marie Douglas MRN: [id number] Date of Birth: Apr 17, 1961 Referring Provider:  No ref. provider found  Encounter Date: 02/21/2019   PHYSICAL THERAPY DISCHARGE SUMMARY  Visits from Start of Care: 10  Current functional level related to goals / functional outcomes: At last re-assessment patient had achieved all goals and patient had been given a follow-up visit for one month following last re-assessment. Patient called and reported she was doing well and did not need follow-up visit.    Remaining deficits: At last re-assessment patient had achieved all goals and patient had been given a follow-up visit for one month following last re-assessment. Patient called and reported she was doing well and did not need follow-up visit.    Education / Equipment: Patient had been educated on HEP.  Plan: Patient agrees to discharge.  Patient goals were met. Patient is being discharged due to being pleased with the current functional level.  ?????               Clarene Critchley PT, DPT 4:17 PM, 02/21/19 [place], [place], [address] Phone: [phone]   Fax:  [phone]

## 2019-07-18 HISTORY — PX: COLON RESECTION: SHX5231

## 2019-07-18 HISTORY — PX: PORTACATH PLACEMENT: SHX2246

## 2019-12-20 ENCOUNTER — Ambulatory Visit: Payer: Self-pay | Attending: Internal Medicine

## 2019-12-20 DIAGNOSIS — Z23 Encounter for immunization: Secondary | ICD-10-CM

## 2019-12-20 NOTE — Progress Notes (Signed)
   Covid-19 Vaccination Clinic  Name:  Marie Douglas    MRN: [id number] DOB: Jun 07, 1961  12/20/2019  [name] was observed post Covid-19 immunization for 15 minutes without incident. She was provided with Vaccine Information Sheet and instruction to access the V-Safe system.   [name] was instructed to call 911 with any severe reactions post vaccine: Marland Kitchen Difficulty breathing  . Swelling of face and throat  . A fast heartbeat  . A bad rash all over body  . Dizziness and weakness   Immunizations Administered    Name Date Dose VIS Date Route   Pfizer COVID-19 Vaccine 12/20/2019 11:09 AM 0.3 mL 09/10/2018 Intramuscular   Manufacturer: [employer], [employer]   Lot: [id number]   [place]: [phone]

## 2020-01-10 ENCOUNTER — Ambulatory Visit: Payer: Commercial Managed Care - PPO | Attending: Internal Medicine

## 2020-01-10 DIAGNOSIS — Z23 Encounter for immunization: Secondary | ICD-10-CM

## 2020-01-10 NOTE — Progress Notes (Signed)
   Covid-19 Vaccination Clinic  Name:  Marie Douglas    MRN: [id number] DOB: 25-Feb-1961  01/10/2020  [name] was observed post Covid-19 immunization for 15 minutes without incident. She was provided with Vaccine Information Sheet and instruction to access the V-Safe system.   [name] was instructed to call 911 with any severe reactions post vaccine: Marland Kitchen Difficulty breathing  . Swelling of face and throat  . A fast heartbeat  . A bad rash all over body  . Dizziness and weakness   Immunizations Administered    Name Date Dose VIS Date Route   Pfizer COVID-19 Vaccine 01/10/2020 11:04 AM 0.3 mL 09/10/2018 Intramuscular   Manufacturer: [place]   Lot: [id number]   [place]: [id number]

## 2020-07-15 ENCOUNTER — Ambulatory Visit: Payer: Self-pay

## 2020-07-15 ENCOUNTER — Ambulatory Visit
Admission: EM | Admit: 2020-07-15 | Discharge: 2020-07-15 | Disposition: A | Discharge status: Home / Self Care | Payer: Commercial Managed Care - PPO | Attending: Family Medicine | Admitting: Family Medicine

## 2020-07-15 ENCOUNTER — Encounter: Payer: Self-pay | Admitting: Emergency Medicine

## 2020-07-15 ENCOUNTER — Other Ambulatory Visit: Payer: Self-pay

## 2020-07-15 DIAGNOSIS — R5383 Other fatigue: Secondary | ICD-10-CM

## 2020-07-15 DIAGNOSIS — R000 Tachycardia, unspecified: Secondary | ICD-10-CM

## 2020-07-15 DIAGNOSIS — B349 Viral infection, unspecified: Secondary | ICD-10-CM

## 2020-07-15 DIAGNOSIS — R197 Diarrhea, unspecified: Secondary | ICD-10-CM

## 2020-07-15 DIAGNOSIS — R509 Fever, unspecified: Secondary | ICD-10-CM

## 2020-07-15 DIAGNOSIS — R110 Nausea: Secondary | ICD-10-CM | POA: Diagnosis present

## 2020-07-15 DIAGNOSIS — R059 Cough, unspecified: Secondary | ICD-10-CM | POA: Diagnosis present

## 2020-07-15 MED ORDER — ONDANSETRON 4 MG PO TBDP: 4.0000 mg | ORAL_TABLET | Freq: Three times a day (TID) | ORAL | 0 refills | Status: DC | PRN

## 2020-07-15 MED ORDER — PROMETHAZINE-DM 6.25-15 MG/5ML PO SYRP: 5.0000 mL | ORAL_SOLUTION | Freq: Four times a day (QID) | ORAL | 0 refills | Status: DC | PRN

## 2020-07-15 NOTE — ED Provider Notes (Signed)
[place]   [id number] 07/15/20 Arrival Time: [id number]   CC: COVID symptoms  SUBJECTIVE: History from: patient.  Marie Douglas is a 59 y.o. female who presents with abrupt onset of abdominal cramping, nausea, vomiting and diarrhea, cough x 9 days. Has not vomited in 24 hours. Denies sick exposure to COVID, flu or strep. Denies recent travel. Has negative history of Covid. Has completed Covid vaccines. Has not taken OTC medications for this. Symptoms are aggravated by eating. Denies previous symptoms in the past. Denies fever, chills, fatigue, sinus pain, rhinorrhea, sore throat, SOB, wheezing, chest pain  ROS: As per HPI.  All other pertinent ROS negative.     Past Medical History:  Diagnosis Date  . Anxiety   . Depression    History reviewed. No pertinent surgical history. Allergies  Allergen Reactions  . Ciprofloxacin Other (See Comments)    Bad taste in her mouth   . Epinephrine    No current facility-administered medications on file prior to encounter.   No current outpatient medications on file prior to encounter.   Social History   Socioeconomic History  . Marital status: Married    Spouse name: Not on file  . Number of children: Not on file  . Years of education: Not on file  . Highest education level: Not on file  Occupational History  . Not on file  Tobacco Use  . Smoking status: Former Smoker    Packs/day: 1.00    Years: 10.00    Pack years: 10.00    Types: Cigarettes  . Smokeless tobacco: Never Used  Substance and Sexual Activity  . Alcohol use: Not Currently  . Drug use: Not on file  . Sexual activity: Not on file  Other Topics Concern  . Not on file  Social History Narrative  . Not on file   Social Determinants of Health   Financial Resource Strain: Not on file  Food Insecurity: Not on file  Transportation Needs: Not on file  Physical Activity: Not on file  Stress: Not on file  Social Connections: Not on file  Intimate Partner  Violence: Not on file   No family history on file.  OBJECTIVE:  Vitals:   07/15/20 1137 07/15/20 1139  BP:  (!) 187/76  Pulse:  (!) 102  Resp:  19  Temp:  98.4 F (36.9 C)  TempSrc:  Oral  SpO2:  96%  Height: 5\' 8"  (1.727 m)      General appearance: alert; appears fatigued, but nontoxic; speaking in full sentences and tolerating own secretions HEENT: NCAT; Ears: EACs clear, TMs pearly gray; Eyes: PERRL.  EOM grossly intact. Sinuses: nontender; Nose: nares patent without rhinorrhea, Throat: oropharynx erythematous, cobblestoning present, tonsils non erythematous or enlarged, uvula midline  Neck: supple without LAD Lungs: unlabored respirations, symmetrical air entry; cough: absent; no respiratory distress; CTAB Heart: regular rate and rhythm.  Radial pulses 2+ symmetrical bilaterally Skin: warm and dry Psychological: alert and cooperative; normal mood and affect  LABS:  No results found for this or any previous visit (from the past 24 hour(s)).   ASSESSMENT & PLAN:  1. Viral illness   2. Diarrhea, unspecified type   3. Fever, unspecified fever cause   4. Tachycardia   5. Other fatigue   6. Cough   7. Nausea     Meds ordered this encounter  Medications  . promethazine-dextromethorphan (PROMETHAZINE-DM) 6.25-15 MG/5ML syrup    Sig: Take 5 mLs by mouth 4 (four) times daily as needed for  cough.    Dispense:  118 mL    Refill:  0    Order Specific Question:   Supervising Provider    Answer:   Merrilee Jansky [id number]  . ondansetron (ZOFRAN ODT) 4 MG disintegrating tablet    Sig: Take 1 tablet (4 mg total) by mouth every 8 (eight) hours as needed for nausea or vomiting.    Dispense:  20 tablet    Refill:  0    Order Specific Question:   Supervising Provider    Answer:   Merrilee Jansky [id number]   Prescribed promethazine cough syrup Sedation precautions given Prescribed zofran GI pathogen panel pending Will inform of abnormal results and treat  accordingly Continue supportive care at home COVID and flu testing ordered.  It will take between 1-2 days for test results.  Someone will contact you regarding abnormal results.   Work note provided Patient should remain in quarantine until they have received Covid results.  If negative you may resume normal activities (go back to work/school) while practicing hand hygiene, social distance, and mask wearing.  If positive, patient should remain in quarantine for 10 days from symptom onset AND greater than 72 hours after symptoms resolution (absence of fever without the use of fever-reducing medication and improvement in respiratory symptoms), whichever is longer Get plenty of rest and push fluids Use OTC zyrtec for nasal congestion, runny nose, and/or sore throat Use OTC flonase for nasal congestion and runny nose Use medications daily for symptom relief Use OTC medications like ibuprofen or tylenol as needed fever or pain Call or go to the ED if you have any new or worsening symptoms such as fever, worsening cough, shortness of breath, chest tightness, chest pain, turning blue, changes in mental status.  Reviewed expectations re: course of current medical issues. Questions answered. Outlined signs and symptoms indicating need for more acute intervention. Patient verbalized understanding. After Visit Summary given.         Moshe Cipro, NP 07/18/20 1000

## 2020-07-15 NOTE — Discharge Instructions (Addendum)
Liquid IV, gatorade, water to increase fluids  I have sent in zofran for your nausea to take one tablet every 6 hours as needed for nausea  I have sent in cough syrup for you to take. This medication can make you sleepy. Do not drive while taking this medication.  I have sent a specimen kit for you to take and we will check a GI pathogen panel. Bring the specimen back as soon as you have it  You may take imodium for diarrhea  Your COVID tests are pending.  You should self quarantine until the test results are back.    Take Tylenol or ibuprofen as needed for fever or discomfort.  Rest and keep yourself hydrated.    Follow-up with your primary care provider if your symptoms are not improving.

## 2020-07-15 NOTE — ED Triage Notes (Addendum)
Stomach cramping and diarrhea that started over a week ago. Started vomiting on Monday but has not vomited in 24 hours. Pt is still having diarrhea. Pt states she feels better today

## 2020-07-17 LAB — SARS-COV-2, NAA 2 DAY TAT

## 2020-07-17 LAB — NOVEL CORONAVIRUS, NAA: SARS-CoV-2, NAA: NOT DETECTED

## 2020-07-18 LAB — C DIFFICILE (CDIFF) QUICK SCRN (NO PCR REFLEX)
C Diff antigen: NEGATIVE
C Diff interpretation: NOT DETECTED
C Diff toxin: NEGATIVE

## 2020-07-19 LAB — GASTROINTESTINAL PANEL BY PCR, STOOL (REPLACES STOOL CULTURE)

## 2020-07-22 ENCOUNTER — Other Ambulatory Visit: Payer: Self-pay

## 2020-07-22 ENCOUNTER — Emergency Department (HOSPITAL_COMMUNITY)
Admission: EM | Admit: 2020-07-22 | Discharge: 2020-07-22 | Disposition: A | Discharge status: Home / Self Care | Payer: Commercial Managed Care - PPO | Attending: Emergency Medicine | Admitting: Emergency Medicine

## 2020-07-22 ENCOUNTER — Encounter (HOSPITAL_COMMUNITY): Payer: Self-pay | Admitting: Emergency Medicine

## 2020-07-22 DIAGNOSIS — I10 Essential (primary) hypertension: Secondary | ICD-10-CM | POA: Insufficient documentation

## 2020-07-22 DIAGNOSIS — R109 Unspecified abdominal pain: Secondary | ICD-10-CM | POA: Insufficient documentation

## 2020-07-22 DIAGNOSIS — R197 Diarrhea, unspecified: Secondary | ICD-10-CM | POA: Insufficient documentation

## 2020-07-22 DIAGNOSIS — R1111 Vomiting without nausea: Secondary | ICD-10-CM | POA: Insufficient documentation

## 2020-07-22 DIAGNOSIS — Z87891 Personal history of nicotine dependence: Secondary | ICD-10-CM | POA: Insufficient documentation

## 2020-07-22 DIAGNOSIS — D509 Iron deficiency anemia, unspecified: Secondary | ICD-10-CM | POA: Insufficient documentation

## 2020-07-22 DIAGNOSIS — D649 Anemia, unspecified: Secondary | ICD-10-CM | POA: Diagnosis present

## 2020-07-22 HISTORY — DX: Anemia, unspecified: D64.9

## 2020-07-22 NOTE — ED Provider Notes (Signed)
[facility] EMERGENCY DEPARTMENT Provider Note   CSN: [id number] Arrival date & time: 07/22/20  1727     History Chief Complaint  Patient presents with   Abnormal Lab    Marie Douglas is a 60 y.o. female.  HPI She presents for evaluation and treatment of anemia.  This was an incidental finding, during evaluation for abdominal discomfort that has been present for 2 weeks.  She was evaluated yesterday and had blood drawn today.  Relative to the GI distress, she has been having intermittent vomiting and diarrhea for about 2 weeks.  She is not having fever, blood in emesis or stool, ongoing severe abdominal pain, weakness, shortness of breath or chest pain.  No prior similar problems.  She has not seen a doctor for evaluation in many many years.  She has history of anemia during pregnancy several decades ago.  There are no other known modifying factors.    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 No pertinent surgical history.   OB History   No obstetric history on file.     History reviewed. No pertinent family history.  Social History   Tobacco Use   Smoking status: Former Smoker    Packs/day: 1.00    Years: 10.00    Pack years: 10.00    Types: Cigarettes   Smokeless tobacco: Never Used  Scientific laboratory technician Use: Never used  Substance Use Topics   Alcohol use: Not Currently   Drug use: Never    Home Medications Prior to Admission medications   Medication Sig Start Date End Date Taking? Authorizing Provider  ondansetron (ZOFRAN ODT) 4 MG disintegrating tablet Take 1 tablet (4 mg total) by mouth every 8 (eight) hours as needed for nausea or vomiting. 07/15/20   Faustino Congress, NP  promethazine-dextromethorphan (PROMETHAZINE-DM) 6.25-15 MG/5ML syrup Take 5 mLs by mouth 4 (four) times daily as  needed for cough. 07/15/20   Faustino Congress, NP    Allergies    Epinephrine and Ciprofloxacin  Review of Systems   Review of Systems  All other systems reviewed and are negative.   Physical Exam Updated Vital Signs BP (!) 184/89 (BP Location: Right Arm)    Pulse (!) 107    Temp 98.2 F (36.8 C) (Oral)    Resp 18    Ht 5\' 8"  (1.727 m)    Wt 83.5 kg    SpO2 99%    BMI 27.98 kg/m   Physical Exam Vitals and nursing note reviewed.  Constitutional:      General: She is not in acute distress.    Appearance: She is well-developed and well-nourished. She is not ill-appearing, toxic-appearing or diaphoretic.  HENT:     Head: Normocephalic and atraumatic.     Right Ear: External ear normal.     Left Ear: External ear normal.  Eyes:     Extraocular Movements: EOM normal.     Conjunctiva/sclera: Conjunctivae normal.     Pupils: Pupils are equal, round, and reactive to light.  Neck:     Trachea: Phonation normal.  Cardiovascular:     Rate and Rhythm: Normal rate.  Pulmonary:     Effort: Pulmonary effort is normal.  Chest:     Chest wall: No bony tenderness.  Abdominal:  General: There is no distension.  Musculoskeletal:        General: Normal range of motion.     Cervical back: Normal range of motion and neck supple.  Skin:    General: Skin is warm, dry and intact.  Neurological:     Mental Status: She is alert and oriented to person, place, and time.     Cranial Nerves: No cranial nerve deficit.     Sensory: No sensory deficit.     Motor: No abnormal muscle tone.     Coordination: Coordination normal.  Psychiatric:        Mood and Affect: Mood and affect and mood normal.        Behavior: Behavior normal.        Thought Content: Thought content normal.        Judgment: Judgment normal.     ED Results / Procedures / Treatments   Labs (all labs ordered are listed, but only abnormal results are displayed) Labs Reviewed - No data to  display  EKG None  Radiology No results found.  Procedures Procedures (including critical care time)  Medications Ordered in ED Medications - No data to display  ED Course  I have reviewed the triage vital signs and the nursing notes.  Pertinent labs & imaging results that were available during my care of the patient were reviewed by me and considered in my medical decision making (see chart for details).    MDM Rules/Calculators/A&P                           Patient Vitals for the past 24 hrs:  BP Temp Temp src Pulse Resp SpO2 Height Weight  07/22/20 2221 (!) 184/89 -- -- (!) 107 18 99 % -- --  07/22/20 1858 (!) 201/93 98.2 F (36.8 C) Oral (!) 124 18 98 % 5\' 8"  (1.727 m) 83.5 kg    9:59 PM Reevaluation with update and discussion. After initial assessment and treatment, an updated evaluation reveals patient remains comfortable has no additional complaints.  5 discussed with patient all questions answered. Daleen Bo   Medical Decision Making:  This patient is presenting for evaluation of anemia, which does require a range of treatment options, and is a complaint that involves a moderate risk of morbidity and mortality. The differential diagnoses include blood loss, abnormal blood manufacture. I decided to review old records, and in summary healthy middle-aged female presenting for evaluation of incidental anemia found during evaluation for abdominal pain.  Abdominal symptoms are mild, and intermittent.  I did not require additional historical information from anyone.    Critical Interventions-clinical evaluation, review of outpatient laboratory testing  After These Interventions, the Patient was reevaluated and was found stable for discharge.  Patient borderline requirement for transfusion, but essentially asymptomatic for anemia with hemoglobin 6.9.  MCV very low indicating iron deficiency anemia.  After discussion with patient she prefers to take oral iron tablets and  follow-up with PCP for ongoing management.  She has incidental asymptomatic hypertension that can be followed as an outpatient.  She is instructed to stay on a low-salt diet.  Notification for hospitalization at this time.  Doubt hypertensive urgency, metabolic instability or impending vascular collapse  CRITICAL CARE- yes Performed by: Daleen Bo  Nursing Notes Reviewed/ Care Coordinated Applicable Imaging Reviewed Interpretation of Laboratory Data incorporated into ED treatment  The patient appears reasonably screened and/or stabilized for discharge and I doubt any other  medical condition or other [facility] requiring further screening, evaluation, or treatment in the ED at this time prior to discharge.  Plan: Home Medications-continue usual, take iron 325 mg twice daily for 3 weeks; Home Treatments-regular diet; return here if the recommended treatment, does not improve the symptoms; Recommended follow up-PCP follow-up for hemoglobin check 2 or 3 weeks.     Final Clinical Impression(s) / ED Diagnoses Final diagnoses:  Iron deficiency anemia, unspecified iron deficiency anemia type  Hypertension, unspecified type    Rx / DC Orders ED Discharge Orders    None       Mancel Bale, MD 07/27/20 [phone]

## 2020-07-22 NOTE — Discharge Instructions (Addendum)
It appears that your anemia is from iron deficiency.  Your hemoglobin is just barely below the transfusion threshold.  Your MCV is very low indicating low iron which prevents manufacture of hemoglobin.  To treat this take iron, 325 mg, twice a day for 3 weeks.  This may cause some GI distress, and/or constipation.  Make sure you are drinking plenty of fluids and getting some fiber in your diet.  You can also follow the IBS diet that you are physician recommended.    Your blood pressure is elevated.  Stay on a low-salt diet, to help lower your blood pressure.  Have your doctor recheck your blood pressure, when you see them in 2 weeks.  Return here, if needed, for problems.

## 2020-07-22 NOTE — ED Triage Notes (Signed)
Pt to the ED for asymptomatic anemia.  PCP advised pt to the ED for a blood transfusion with Hemoglobin of 6.9.

## 2020-07-28 DIAGNOSIS — D649 Anemia, unspecified: Secondary | ICD-10-CM | POA: Insufficient documentation

## 2020-07-29 DIAGNOSIS — D500 Iron deficiency anemia secondary to blood loss (chronic): Secondary | ICD-10-CM | POA: Insufficient documentation

## 2020-07-29 DIAGNOSIS — K6389 Other specified diseases of intestine: Secondary | ICD-10-CM | POA: Insufficient documentation

## 2020-07-29 DIAGNOSIS — R7309 Other abnormal glucose: Secondary | ICD-10-CM | POA: Insufficient documentation

## 2020-07-29 DIAGNOSIS — K56600 Partial intestinal obstruction, unspecified as to cause: Secondary | ICD-10-CM | POA: Insufficient documentation

## 2020-07-29 DIAGNOSIS — R030 Elevated blood-pressure reading, without diagnosis of hypertension: Secondary | ICD-10-CM | POA: Insufficient documentation

## 2020-07-29 DIAGNOSIS — R112 Nausea with vomiting, unspecified: Secondary | ICD-10-CM | POA: Insufficient documentation

## 2020-08-19 DIAGNOSIS — I10 Essential (primary) hypertension: Secondary | ICD-10-CM | POA: Insufficient documentation

## 2020-08-19 DIAGNOSIS — C19 Malignant neoplasm of rectosigmoid junction: Secondary | ICD-10-CM

## 2020-08-19 DIAGNOSIS — E041 Nontoxic single thyroid nodule: Secondary | ICD-10-CM | POA: Insufficient documentation

## 2020-08-19 HISTORY — DX: Malignant neoplasm of rectosigmoid junction: C19

## 2020-09-14 DIAGNOSIS — Z86718 Personal history of other venous thrombosis and embolism: Secondary | ICD-10-CM

## 2020-09-14 HISTORY — DX: Personal history of other venous thrombosis and embolism: Z86.718

## 2021-02-07 ENCOUNTER — Ambulatory Visit: Payer: Self-pay | Admitting: Urology

## 2021-02-10 ENCOUNTER — Ambulatory Visit
Admission: RE | Admit: 2021-02-10 | Discharge: 2021-02-10 | Disposition: A | Discharge status: Home / Self Care | Payer: Commercial Managed Care - PPO | Source: Ambulatory Visit | Attending: Urology | Admitting: Urology

## 2021-02-10 ENCOUNTER — Encounter: Payer: Self-pay | Admitting: Urology

## 2021-02-10 ENCOUNTER — Ambulatory Visit (INDEPENDENT_AMBULATORY_CARE_PROVIDER_SITE_OTHER): Payer: Commercial Managed Care - PPO | Admitting: Urology

## 2021-02-10 ENCOUNTER — Other Ambulatory Visit: Payer: Self-pay

## 2021-02-10 VITALS — BP 187/82 | HR 98 | Ht 68.0 in | Wt 177.0 lb

## 2021-02-10 DIAGNOSIS — N200 Calculus of kidney: Secondary | ICD-10-CM

## 2021-02-10 DIAGNOSIS — R310 Gross hematuria: Secondary | ICD-10-CM | POA: Diagnosis not present

## 2021-02-10 NOTE — Progress Notes (Signed)
02/10/2021 5:09 PM   Marie Douglas 1961-03-02 [id number]  Referring provider: Doreatha Lew, MD [facility] [address] [place],  [place] [address]  Chief Complaint  Patient presents with   Hematuria    HPI: Marie Douglas is a 60 y.o. female referred for evaluation of hematuria.  Stage IIIB colon cancer status post right hemicolectomy currently undergoing chemotherapy [facility] with oxaliplatin, leucovorin and fluorouracil States after starting chemotherapy she began having intermittent gross hematuria On Eliquis for history of DVT Typically she has total gross painless hematuria that is intermittent though recently she had hematuria for 6 days straight which resolved yesterday With this most recent episode she has had intermittent left flank and lower abdominal pain and the past 24 hours a sensation of discomfort in her urethra She has a prior history of stone disease; CT abdomen/pelvis with contrast April 2022 showed nonobstructing right lower pole renal calculi and bilateral simple renal cysts Urinalysis 02/08/2021 with >100 RBCs; urine culture without significant growth   PMH: Past Medical History:  Diagnosis Date   Anemia    Anxiety    Depression     Surgical History: Right hemicolectomy  Home Medications:  Allergies as of 02/10/2021       Reactions   Epinephrine Other (See Comments)   Makes her want to jump out of her skin   Ciprofloxacin Other (See Comments)   Bad taste in her mouth         Medication List        Accurate as of February 10, 2021  5:09 PM. If you have any questions, ask your nurse or doctor.          STOP taking these medications    ondansetron 4 MG disintegrating tablet Commonly known as: Zofran ODT Stopped by: Abbie Sons, MD   promethazine-dextromethorphan 6.25-15 MG/5ML syrup Commonly known as: PROMETHAZINE-DM Stopped by: Abbie Sons, MD       TAKE these medications    amLODipine  5 MG tablet Commonly known as: NORVASC Take by mouth.   Eliquis 5 MG Tabs tablet Generic drug: apixaban Take 5 mg by mouth 2 (two) times daily.   pantoprazole 20 MG tablet Commonly known as: PROTONIX Take 20 mg by mouth daily.        Allergies:  Allergies  Allergen Reactions   Epinephrine Other (See Comments)    Makes her want to jump out of her skin    Ciprofloxacin Other (See Comments)    Bad taste in her mouth     Family History: No family history on file.  Social History:  reports that she has quit smoking. Her smoking use included cigarettes. She has a 10.00 pack-year smoking history. She has never used smokeless tobacco. She reports previous alcohol use. She reports that she does not use drugs.   Physical Exam: BP (!) 187/82   Pulse 98   Ht '5\' 8"'$  (1.727 m)   Wt 177 lb (80.3 kg)   BMI 26.91 kg/m   Constitutional:  Alert and oriented, No acute distress. HEENT: [place] AT, moist mucus membranes.  Trachea midline, no masses. Cardiovascular: No clubbing, cyanosis, or edema. Respiratory: Normal respiratory effort, no increased work of breathing. Neurologic: Grossly intact, no focal deficits, moving all 4 extremities. Psychiatric: Normal mood and affect.  Laboratory Data:  Urinalysis Dipstick 3+ blood/trace leukocytes Microscopy >30 RBC  Pertinent Imaging: CT images 10/26/2020 were personally reviewed and interpreted   Assessment & Plan:  1. Gross hematuria Recent CT with nonobstructing right renal calculi and no upper tract abnormalities Although she is having left flank pain a KUB was ordered to see if any of her right renal calculi may have migrated to the ureter Her current chemotherapy regimen has rarely been associated with bleeding from multiple sites however was unable to specifically identify hemorrhagic cystitis Schedule cystoscopy for lower tract evaluation    Abbie Sons, MD  [facility] [address] [place], [place] [address] [phone]

## 2021-02-11 LAB — URINALYSIS, COMPLETE
Bilirubin, UA: NEGATIVE
Glucose, UA: NEGATIVE
Ketones, UA: NEGATIVE
Nitrite, UA: NEGATIVE
Protein,UA: NEGATIVE
Specific Gravity, UA: 1.015 (ref 1.005–1.030)
Urobilinogen, Ur: 0.2 mg/dL (ref 0.2–1.0)
pH, UA: 7 (ref 5.0–7.5)

## 2021-02-11 LAB — MICROSCOPIC EXAMINATION
Bacteria, UA: NONE SEEN
RBC, Urine: >30 /hpf — AB (ref 0–2)

## 2021-02-12 ENCOUNTER — Encounter: Payer: Self-pay | Admitting: Urology

## 2021-02-15 ENCOUNTER — Telehealth: Payer: Self-pay | Admitting: *Deleted

## 2021-02-15 NOTE — Telephone Encounter (Signed)
-----   Message from Abbie Sons, MD sent at 02/15/2021  2:29 PM EDT ----- KUB reviewed.  Right renal calculi are seen on KUB.  I do not see any evidence of migration of the stone to the ureter which could be a source of the blood in her urine.  It does not look like cystoscopy has been scheduled-please schedule

## 2021-02-15 NOTE — Telephone Encounter (Signed)
Notified patient as instructed,  Patient want to wait until end of september 2022 scheduled cysto than

## 2021-02-25 ENCOUNTER — Ambulatory Visit: Payer: Self-pay | Admitting: Urology

## 2021-03-01 ENCOUNTER — Encounter: Payer: Self-pay | Admitting: Urology

## 2021-03-02 ENCOUNTER — Ambulatory Visit: Payer: Self-pay | Admitting: Urology

## 2021-03-22 ENCOUNTER — Encounter: Payer: Self-pay | Admitting: Urology

## 2021-04-05 ENCOUNTER — Other Ambulatory Visit: Payer: Self-pay | Admitting: Student

## 2021-04-05 DIAGNOSIS — Z1231 Encounter for screening mammogram for malignant neoplasm of breast: Secondary | ICD-10-CM

## 2021-04-11 ENCOUNTER — Ambulatory Visit: Payer: Self-pay | Admitting: Podiatry

## 2021-04-14 ENCOUNTER — Other Ambulatory Visit: Payer: Self-pay | Admitting: Urology

## 2021-05-02 ENCOUNTER — Other Ambulatory Visit: Payer: Commercial Managed Care - PPO | Admitting: Urology

## 2021-05-19 ENCOUNTER — Other Ambulatory Visit: Payer: Self-pay

## 2021-05-19 ENCOUNTER — Encounter: Payer: Self-pay | Admitting: Urology

## 2021-05-19 ENCOUNTER — Ambulatory Visit (INDEPENDENT_AMBULATORY_CARE_PROVIDER_SITE_OTHER): Payer: Commercial Managed Care - PPO | Admitting: Urology

## 2021-05-19 VITALS — BP 193/72 | HR 118 | Ht 68.0 in | Wt 180.0 lb

## 2021-05-19 DIAGNOSIS — R310 Gross hematuria: Secondary | ICD-10-CM | POA: Diagnosis not present

## 2021-05-19 LAB — URINALYSIS, COMPLETE
Bilirubin, UA: NEGATIVE
Glucose, UA: NEGATIVE
Ketones, UA: NEGATIVE
Nitrite, UA: NEGATIVE
Protein,UA: NEGATIVE
Specific Gravity, UA: 1.015 (ref 1.005–1.030)
Urobilinogen, Ur: 0.2 mg/dL (ref 0.2–1.0)
pH, UA: 7 (ref 5.0–7.5)

## 2021-05-19 LAB — MICROSCOPIC EXAMINATION: RBC, Urine: >30 /hpf — ABNORMAL HIGH (ref 0–2)

## 2021-05-19 NOTE — Progress Notes (Signed)
   05/19/21  CC:  Chief Complaint  Patient presents with   Cysto    HPI: History gross hematuria with persistent microhematuria.  Denies recurrent gross hematuria.  Repeat CT with contrast performed 05/11/2021 at [facility] showed scattered nonobstructing right renal calculi largest measuring 9 mm.  No hydronephrosis  Blood pressure (!) 193/72, pulse (!) 118, height 5\' 8"  (1.727 m), weight 180 lb (81.6 kg). NED. A&Ox3.   No respiratory distress   Abd soft, NT, ND Normal external genitalia with patent urethral meatus  Cystoscopy Procedure Note  Patient identification was confirmed, informed consent was obtained, and patient was prepped using Betadine solution.  Lidocaine jelly was administered per urethral meatus.    Procedure: - Flexible cystoscope introduced, without any difficulty.   - Thorough search of the bladder revealed:    normal urethral meatus    normal urothelium    no stones    no ulcers     no tumors    no urethral polyps    no trabeculation  - Ureteral orifices were normal in position and appearance.  Post-Procedure: - Patient tolerated the procedure well  Assessment/ Plan: No mucosal abnormalities on cystoscopy Urine cytology sent 87-month follow-up with [facility]   Abbie Sons, MD

## 2021-05-20 LAB — CYTOLOGY - NON PAP

## 2021-05-21 ENCOUNTER — Encounter: Payer: Self-pay | Admitting: Urology

## 2021-06-01 ENCOUNTER — Ambulatory Visit: Payer: Commercial Managed Care - PPO | Admitting: Podiatry

## 2021-06-06 ENCOUNTER — Ambulatory Visit
Admission: RE | Admit: 2021-06-06 | Discharge: 2021-06-06 | Disposition: A | Discharge status: Home / Self Care | Payer: Commercial Managed Care - PPO | Source: Ambulatory Visit | Attending: Student | Admitting: Student

## 2021-06-06 ENCOUNTER — Other Ambulatory Visit: Payer: Commercial Managed Care - PPO

## 2021-06-06 ENCOUNTER — Other Ambulatory Visit: Payer: Self-pay

## 2021-06-06 DIAGNOSIS — Z1231 Encounter for screening mammogram for malignant neoplasm of breast: Secondary | ICD-10-CM | POA: Diagnosis present

## 2021-06-06 DIAGNOSIS — N200 Calculus of kidney: Secondary | ICD-10-CM

## 2021-06-06 NOTE — Progress Notes (Signed)
[id number]

## 2021-06-10 LAB — CALCULI, WITH PHOTOGRAPH (CLINICAL LAB)
Calcium Oxalate Dihydrate: 30 %
Calcium Oxalate Monohydrate: 40 %
Hydroxyapatite: 30 %
Weight Calculi: 21 mg

## 2021-06-13 ENCOUNTER — Encounter: Payer: Self-pay | Admitting: *Deleted

## 2021-06-15 ENCOUNTER — Ambulatory Visit (INDEPENDENT_AMBULATORY_CARE_PROVIDER_SITE_OTHER): Payer: Commercial Managed Care - PPO | Admitting: Podiatry

## 2021-06-15 ENCOUNTER — Encounter: Payer: Self-pay | Admitting: Podiatry

## 2021-06-15 ENCOUNTER — Other Ambulatory Visit: Payer: Self-pay

## 2021-06-15 DIAGNOSIS — M79676 Pain in unspecified toe(s): Secondary | ICD-10-CM | POA: Diagnosis not present

## 2021-06-15 DIAGNOSIS — B351 Tinea unguium: Secondary | ICD-10-CM

## 2021-06-15 NOTE — Progress Notes (Signed)
  Subjective:  Patient ID: Marie Douglas, female    DOB: 11/08/1960,  MRN: [id number] HPI Chief Complaint  Patient presents with   Foot Pain    Foot Exam - concerned with thick, discolored nails, hammer toes, swelling in feet, numbness - patient just completed chemo for colon cancer    New Patient (Initial Visit)    60 y.o. female presents with the above complaint.   ROS: Denies fever chills nausea vomiting muscle aches pains calf pain back pain chest pain shortness of breath.  Past Medical History:  Diagnosis Date   Anemia    Anxiety    Depression    No past surgical history on file.  Current Outpatient Medications:    ALPRAZolam (XANAX) 0.5 MG tablet, Take 0.5 mg by mouth daily as needed., Disp: , Rfl:    amLODipine (NORVASC) 10 MG tablet, Take 10 mg by mouth daily., Disp: , Rfl:    potassium chloride (KLOR-CON) 10 MEQ tablet, Take by mouth., Disp: , Rfl:    prochlorperazine (COMPAZINE) 10 MG tablet, Take 10 mg by mouth every 6 (six) hours., Disp: , Rfl:   Allergies  Allergen Reactions   Epinephrine Other (See Comments)    Makes her want to jump out of her skin    Ciprofloxacin Other (See Comments)    Bad taste in her mouth    Review of Systems Objective:  There were no vitals filed for this visit.  General: Well developed, nourished, in no acute distress, alert and oriented x3   Dermatological: Skin is warm, dry and supple bilateral. Nails x 10 are thickened yellow and dystrophic.; remaining integument appears unremarkable at this time. There are no open sores, no preulcerative lesions, no rash or signs of infection present.  Vascular: Dorsalis Pedis artery and Posterior Tibial artery pedal pulses are 2/4 bilateral with immedate capillary fill time. Pedal hair growth present. No varicosities and no lower extremity edema present bilateral.   Neruologic: Grossly intact via light touch bilateral. Vibratory intact via tuning fork bilateral. Protective threshold with Semmes  Wienstein monofilament i diminished o all pedal sites bilateral. Patellar and Achilles deep tendon reflexes 2+ bilateral. No Babinski or clonus noted bilateral.   Musculoskeletal: No gross boney pedal deformities bilateral. No pain, crepitus, or limitation noted with foot and ankle range of motion bilateral. Muscular strength 5/5 in all groups tested bilateral.  Flexible hammertoe deformities bilateral.  Gait: Unassisted, Nonantalgic.    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  Long thick yellow dystrophic onychomycotic nails.  Flexible hammertoe deformities bilateral.  Plan: Debrided the toenails 1 through 5 bilateral.  Follow-up with her as needed.     Inza Mikrut T. [place], [place]

## 2021-06-27 ENCOUNTER — Ambulatory Visit (INDEPENDENT_AMBULATORY_CARE_PROVIDER_SITE_OTHER): Payer: Commercial Managed Care - PPO | Admitting: Obstetrics and Gynecology

## 2021-06-27 ENCOUNTER — Other Ambulatory Visit (HOSPITAL_COMMUNITY)
Admission: RE | Admit: 2021-06-27 | Discharge: 2021-06-27 | Disposition: A | Discharge status: Home / Self Care | Payer: Commercial Managed Care - PPO | Source: Ambulatory Visit | Attending: Obstetrics and Gynecology | Admitting: Obstetrics and Gynecology

## 2021-06-27 ENCOUNTER — Other Ambulatory Visit: Payer: Self-pay

## 2021-06-27 ENCOUNTER — Encounter: Payer: Self-pay | Admitting: Obstetrics and Gynecology

## 2021-06-27 VITALS — BP 155/93 | HR 112 | Resp 16 | Ht 68.0 in | Wt 197.7 lb

## 2021-06-27 DIAGNOSIS — Z124 Encounter for screening for malignant neoplasm of cervix: Secondary | ICD-10-CM | POA: Diagnosis not present

## 2021-06-27 DIAGNOSIS — Z85038 Personal history of other malignant neoplasm of large intestine: Secondary | ICD-10-CM | POA: Diagnosis not present

## 2021-06-27 DIAGNOSIS — L918 Other hypertrophic disorders of the skin: Secondary | ICD-10-CM | POA: Diagnosis not present

## 2021-06-27 DIAGNOSIS — Z01419 Encounter for gynecological examination (general) (routine) without abnormal findings: Secondary | ICD-10-CM | POA: Diagnosis not present

## 2021-06-27 NOTE — Addendum Note (Signed)
Addended by: Chilton Greathouse on: 06/27/2021 04:49 PM   Modules accepted: Orders

## 2021-06-27 NOTE — Progress Notes (Signed)
ANNUAL PREVENTATIVE CARE GYNECOLOGY  ENCOUNTER NOTE  Subjective:       Marie Douglas is a 60 y.o.  G56P0010 female here for a routine annual gynecologic exam. The patient has a history of colon cancer, diagnosed last year. Has had surgery and underwent chemotherapy. Has not seen a Editor, commissioning in almost 20 years. The patient is sexually active. The patient is not taking hormone replacement therapy. Patient denies post-menopausal vaginal bleeding. The patient wears seatbelts: yes. The patient participates in regular exercise: no. Has the patient ever been transfused or tattooed?: not asked. The patient reports that there is not domestic violence in her life.  Current complaints: 1.  None.     Gynecologic History No LMP recorded. Patient is postmenopausal. Contraception: post menopausal status Last YQI:[id number]. Last mammogram: 06/06/2021. Results were: normal Last Colonoscopy: Ordered. 07/06/2021.  Last Dexa Scan: has never had one.    Obstetric History OB History  Gravida Para Term Preterm AB Living  1       1    SAB IAB Ectopic Multiple Live Births  1            # Outcome Date GA Lbr Len/2nd Weight Sex Delivery Anes PTL Lv  1 SAB             Past Medical History:  Diagnosis Date   Anemia    Anxiety    Colorectal cancer ([place]) 08/19/2020   Formatting of this note might be different from the original. S/p right hemicolectomy with primary anastomosis 08/05/2020   Depression    Gallstones    History of blood clots 09/2020   Kidney stones     Family History  Problem Relation Age of Onset   Hypertension Maternal Grandmother    Diabetes Maternal Grandmother    Breast cancer Neg Hx     Past Surgical History:  Procedure Laterality Date   COLON RESECTION  2021   PORTACATH PLACEMENT  2021    Social History   Socioeconomic History   Marital status: Married    Spouse name: Not on file   Number of children: Not on file   Years of education: Not on file   Highest education  level: Not on file  Occupational History   Not on file  Tobacco Use   Smoking status: Former    Packs/day: 1.00    Years: 10.00    Pack years: 10.00    Types: Cigarettes   Smokeless tobacco: Never  Vaping Use   Vaping Use: Never used  Substance and Sexual Activity   Alcohol use: Not Currently   Drug use: Never   Sexual activity: Not on file  Other Topics Concern   Not on file  Social History Narrative   Not on file   Social Determinants of Health   Financial Resource Strain: Not on file  Food Insecurity: Not on file  Transportation Needs: Not on file  Physical Activity: Not on file  Stress: Not on file  Social Connections: Not on file  Intimate Partner Violence: Not on file    Current Outpatient Medications on File Prior to Visit  Medication Sig Dispense Refill   ALPRAZolam (XANAX) 0.5 MG tablet Take 0.5 mg by mouth daily as needed.     amLODipine (NORVASC) 10 MG tablet Take 10 mg by mouth daily.     potassium chloride (KLOR-CON) 10 MEQ tablet Take by mouth.     prochlorperazine (COMPAZINE) 10 MG tablet Take 10 mg by mouth every 6 (six)  hours.     No current facility-administered medications on file prior to visit.    Allergies  Allergen Reactions   Epinephrine Other (See Comments)    Makes her want to jump out of her skin    Ciprofloxacin Other (See Comments)    Bad taste in her mouth       Review of Systems ROS Review of Systems - General ROS: negative for - chills, fatigue, fever, hot flashes, night sweats, weight gain or weight loss Psychological ROS: negative for - anxiety, decreased libido, depression, mood swings, physical abuse or sexual abuse Ophthalmic ROS: negative for - blurry vision, eye pain or loss of vision ENT ROS: negative for - headaches, hearing change, visual changes or vocal changes Allergy and Immunology ROS: negative for - hives, itchy/watery eyes or seasonal allergies Hematological and Lymphatic ROS: negative for - bleeding  problems, bruising, swollen lymph nodes or weight loss Endocrine ROS: negative for - galactorrhea, hair pattern changes, hot flashes, malaise/lethargy, mood swings, palpitations, polydipsia/polyuria, skin changes, temperature intolerance or unexpected weight changes Breast ROS: negative for - new or changing breast lumps or nipple discharge Respiratory ROS: negative for - cough or shortness of breath Cardiovascular ROS: negative for - chest pain, irregular heartbeat, palpitations or shortness of breath Gastrointestinal ROS: no abdominal pain, change in bowel habits, or black or bloody stools Genito-Urinary ROS: no dysuria, trouble voiding, or hematuria Musculoskeletal ROS: negative for - joint pain or joint stiffness Neurological ROS: negative for - bowel and bladder control changes Dermatological ROS: negative for rash and skin lesion changes   Objective:   BP (!) 155/93   Pulse (!) 112   Resp 16   Ht 5\' 8"  (1.727 m)   Wt 197 lb 11.2 oz (89.7 kg)   BMI 30.06 kg/m  CONSTITUTIONAL: Well-developed, well-nourished female in no acute distress.  PSYCHIATRIC: Normal mood and affect. Normal behavior. Normal judgment and thought content. [place]: Alert and oriented to person, place, and time. Normal muscle tone coordination. No cranial nerve deficit noted. HENT:  Normocephalic, atraumatic, External right and left ear normal. Oropharynx is clear and moist EYES: Conjunctivae and EOM are normal. Pupils are equal, round, and reactive to light. No scleral icterus.  NECK: Normal range of motion, supple, no masses.  Normal thyroid.  SKIN: Skin is warm and dry. No rash noted. Not diaphoretic. No erythema. No pallor. Multiple skin tags (moles) noted at neckline.  CARDIOVASCULAR: Normal heart rate noted, regular rhythm, no murmur. RESPIRATORY: Clear to auscultation bilaterally. Effort and breath sounds normal, no problems with respiration noted. BREASTS: Symmetric in size. No masses, skin changes,  nipple drainage, or lymphadenopathy. ABDOMEN: Soft, normal bowel sounds, no distention noted.  No tenderness, rebound or guarding.  BLADDER: Normal PELVIC:  Bladder no bladder distension noted  Urethra: normal appearing urethra with no masses, tenderness or lesions  Vulva: normal appearing vulva with no masses, tenderness or lesions  Vagina: moderately atrophic, no discharge or lesions  Cervix: normal appearing cervix without discharge or lesions  Uterus: uterus is normal size, shape, consistency and nontender  Adnexa: normal adnexa in size, nontender and no masses  RV: External Exam NormaI and Normal Sphincter tone  MUSCULOSKELETAL: Normal range of motion. No tenderness.  No cyanosis, clubbing, or edema.  2+ distal pulses. LYMPHATIC: No Axillary, Supraclavicular, or Inguinal Adenopathy.   Labs: Reviewed in Care Everywhere  Assessment:   1. Well woman exam with routine gynecological exam   2. Cervical cancer screening   3. History of colon  cancer   4. Skin tags, multiple acquired     Plan:  Pap: Pap Co Test Mammogram:  UTD Colonoscopy: Ordered for 07/06/2021 for surveillance of colon cancer. Has q 3 month surveillance scheduled.  Labs:  Reviewed in Care Everywhere . Mildly elevated HgbA1c and thyroid levels. Due for repeat with PCP in January.  Skin tags, has appointment in February for new Dermatologist.  Routine preventative health maintenance measures emphasized: Exercise/Diet/Weight control, Tobacco Warnings, Alcohol/Substance use risks, Stress Management, Peer Pressure Issues, and Safe Sex COVID Vaccination status: has completed 2 dose vaccination series and 1 booste.r  Return to Clinic - 1 Year.      Rubie Maid, MD [facility]

## 2021-06-27 NOTE — Patient Instructions (Addendum)
Preventive Care 62-62 Years Old, Female Preventive care refers to lifestyle choices and visits with your health care provider that can promote health and wellness. Preventive care visits are also called wellness exams. What can I expect for my preventive care visit? Counseling Your health care provider may ask you questions about your: Medical history, including: Past medical problems. Family medical history. Pregnancy history. Current health, including: Menstrual cycle. Method of birth control. Emotional well-being. Home life and relationship well-being. Sexual activity and sexual health. Lifestyle, including: Alcohol, nicotine or tobacco, and drug use. Access to firearms. Diet, exercise, and sleep habits. Work and work Statistician. Sunscreen use. Safety issues such as seatbelt and bike helmet use. Physical exam Your health care provider will check your: Height and weight. These may be used to calculate your BMI (body mass index). BMI is a measurement that tells if you are at a healthy weight. Waist circumference. This measures the distance around your waistline. This measurement also tells if you are at a healthy weight and may help predict your risk of certain diseases, such as type 2 diabetes and high blood pressure. Heart rate and blood pressure. Body temperature. Skin for abnormal spots. What immunizations do I need? Vaccines are usually given at various ages, according to a schedule. Your health care provider will recommend vaccines for you based on your age, medical history, and lifestyle or other factors, such as travel or where you work. What tests do I need? Screening Your health care provider may recommend screening tests for certain conditions. This may include: Lipid and cholesterol levels. Diabetes screening. This is done by checking your blood sugar (glucose) after you have not eaten for a while (fasting). Pelvic exam and Pap test. Hepatitis B test. Hepatitis C  test. HIV (human immunodeficiency virus) test. STI (sexually transmitted infection) testing, if you are at risk. Lung cancer screening. Colorectal cancer screening. Mammogram. Talk with your health care provider about when you should start having regular mammograms. This may depend on whether you have a family history of breast cancer. BRCA-related cancer screening. This may be done if you have a family history of breast, ovarian, tubal, or peritoneal cancers. Bone density scan. This is done to screen for osteoporosis. Talk with your health care provider about your test results, treatment options, and if necessary, the need for more tests. Follow these instructions at home: Eating and drinking  Eat a diet that includes fresh fruits and vegetables, whole grains, lean protein, and low-fat dairy products. Take vitamin and mineral supplements as recommended by your health care provider. Do not drink alcohol if: Your health care provider tells you not to drink. You are pregnant, may be pregnant, or are planning to become pregnant. If you drink alcohol: Limit how much you have to 0-1 drink a day. Know how much alcohol is in your drink. In the [place] one drink equals one 12 oz bottle of beer (355 mL), one 5 oz glass of wine (148 mL), or one 1 oz glass of hard liquor (44 mL). Lifestyle Brush your teeth every morning and night with fluoride toothpaste. Floss one time each day. Exercise for at least 30 minutes 5 or more days each week. Do not use any products that contain nicotine or tobacco. These products include cigarettes, chewing tobacco, and vaping devices, such as e-cigarettes. If you need help quitting, ask your health care provider. Do not use drugs. If you are sexually active, practice safe sex. Use a condom or other form of protection to prevent  STIs. If you do not wish to become pregnant, use a form of birth control. If you plan to become pregnant, see your health care provider for a  prepregnancy visit. Take aspirin only as told by your health care provider. Make sure that you understand how much to take and what form to take. Work with your health care provider to find out whether it is safe and beneficial for you to take aspirin daily. Find healthy ways to manage stress, such as: Meditation, yoga, or listening to music. Journaling. Talking to a trusted person. Spending time with friends and family. Minimize exposure to UV radiation to reduce your risk of skin cancer. Safety Always wear your seat belt while driving or riding in a vehicle. Do not drive: If you have been drinking alcohol. Do not ride with someone who has been drinking. When you are tired or distracted. While texting. If you have been using any mind-altering substances or drugs. Wear a helmet and other protective equipment during sports activities. If you have firearms in your house, make sure you follow all gun safety procedures. Seek help if you have been physically or sexually abused. What's next? Visit your health care provider once a year for an annual wellness visit. Ask your health care provider how often you should have your eyes and teeth checked. Stay up to date on all vaccines. This information is not intended to replace advice given to you by your health care provider. Make sure you discuss any questions you have with your health care provider. Document Revised: 12/29/2020 Document Reviewed: 12/29/2020 Elsevier Patient Education  [place].

## 2021-06-29 ENCOUNTER — Encounter: Payer: Commercial Managed Care - PPO | Admitting: Obstetrics and Gynecology

## 2021-07-01 LAB — CYTOLOGY - PAP
Comment: NEGATIVE
Diagnosis: NEGATIVE
High risk HPV: NEGATIVE

## 2021-07-04 ENCOUNTER — Other Ambulatory Visit: Payer: Commercial Managed Care - PPO

## 2021-07-14 ENCOUNTER — Other Ambulatory Visit: Payer: Commercial Managed Care - PPO

## 2021-07-14 ENCOUNTER — Other Ambulatory Visit: Payer: Self-pay

## 2021-07-27 ENCOUNTER — Other Ambulatory Visit: Payer: Self-pay | Admitting: Urology

## 2021-08-10 ENCOUNTER — Ambulatory Visit: Payer: Commercial Managed Care - PPO | Admitting: Dermatology

## 2021-08-15 ENCOUNTER — Other Ambulatory Visit: Payer: Self-pay | Admitting: Urology

## 2021-08-22 ENCOUNTER — Encounter: Payer: Self-pay | Admitting: Dermatology

## 2021-08-22 ENCOUNTER — Ambulatory Visit (INDEPENDENT_AMBULATORY_CARE_PROVIDER_SITE_OTHER): Payer: Commercial Managed Care - PPO | Admitting: Dermatology

## 2021-08-22 ENCOUNTER — Other Ambulatory Visit: Payer: Self-pay

## 2021-08-22 DIAGNOSIS — L738 Other specified follicular disorders: Secondary | ICD-10-CM | POA: Diagnosis not present

## 2021-08-22 DIAGNOSIS — Z1283 Encounter for screening for malignant neoplasm of skin: Secondary | ICD-10-CM | POA: Diagnosis not present

## 2021-08-22 DIAGNOSIS — L814 Other melanin hyperpigmentation: Secondary | ICD-10-CM

## 2021-08-22 DIAGNOSIS — L578 Other skin changes due to chronic exposure to nonionizing radiation: Secondary | ICD-10-CM

## 2021-08-22 DIAGNOSIS — L719 Rosacea, unspecified: Secondary | ICD-10-CM

## 2021-08-22 DIAGNOSIS — L918 Other hypertrophic disorders of the skin: Secondary | ICD-10-CM

## 2021-08-22 DIAGNOSIS — L820 Inflamed seborrheic keratosis: Secondary | ICD-10-CM

## 2021-08-22 DIAGNOSIS — D1800 Hemangioma unspecified site: Secondary | ICD-10-CM

## 2021-08-22 DIAGNOSIS — D229 Melanocytic nevi, unspecified: Secondary | ICD-10-CM

## 2021-08-22 DIAGNOSIS — L821 Other seborrheic keratosis: Secondary | ICD-10-CM

## 2021-08-22 NOTE — Patient Instructions (Addendum)
Recommend daily broad spectrum sunscreen SPF 30+ to sun-exposed areas, reapply every 2 hours as needed. Call for new or changing lesions.  Staying in the shade or wearing long sleeves, sun glasses (UVA+UVB protection) and wide brim hats (4-inch brim around the entire circumference of the hat) are also recommended for sun protection.   Seborrheic Keratosis  What causes seborrheic keratoses? Seborrheic keratoses are harmless, common skin growths that first appear during adult life.  As time goes by, more growths appear.  Some people may develop a large number of them.  Seborrheic keratoses appear on both covered and uncovered body parts.  They are not caused by sunlight.  The tendency to develop seborrheic keratoses can be inherited.  They vary in color from skin-colored to gray, brown, or even black.  They can be either smooth or have a rough, warty surface.   Seborrheic keratoses are superficial and look as if they were stuck on the skin.  Under the microscope this type of keratosis looks like layers upon layers of skin.  That is why at times the top layer may seem to fall off, but the rest of the growth remains and re-grows.    Treatment Seborrheic keratoses do not need to be treated, but can easily be removed in the office.  Seborrheic keratoses often cause symptoms when they rub on clothing or jewelry.  Lesions can be in the way of shaving.  If they become inflamed, they can cause itching, soreness, or burning.  Removal of a seborrheic keratosis can be accomplished by freezing, burning, or surgery. If any spot bleeds, scabs, or grows rapidly, please return to have it checked, as these can be an indication of a skin cancer.  Melanoma ABCDEs  Melanoma is the most dangerous type of skin cancer, and is the leading cause of death from skin disease.  You are more likely to develop melanoma if you: Have light-colored skin, light-colored eyes, or red or blond hair Spend a lot of time in the sun Tan  regularly, either outdoors or in a tanning bed Have had blistering sunburns, especially during childhood Have a close family member who has had a melanoma Have atypical moles or large birthmarks  Early detection of melanoma is key since treatment is typically straightforward and cure rates are extremely high if we catch it early.   The first sign of melanoma is often a change in a mole or a new dark spot.  The ABCDE system is a way of remembering the signs of melanoma.  A for asymmetry:  The two halves do not match. B for border:  The edges of the growth are irregular. C for color:  A mixture of colors are present instead of an even brown color. D for diameter:  Melanomas are usually (but not always) greater than 81mm - the size of a pencil eraser. E for evolution:  The spot keeps changing in size, shape, and color.  Please check your skin once per month between visits. You can use a small mirror in front and a large mirror behind you to keep an eye on the back side or your body.   If you see any new or changing lesions before your next follow-up, please call to schedule a visit.  Please continue daily skin protection including broad spectrum sunscreen SPF 30+ to sun-exposed areas, reapplying every 2 hours as needed when you're outdoors.   Staying in the shade or wearing long sleeves, sun glasses (UVA+UVB protection) and wide brim hats (  4-inch brim around the entire circumference of the hat) are also recommended for sun protection.    If You Need Anything After Your Visit  If you have any questions or concerns for your doctor, please call our main line at [phone] and press option 4 to reach your doctor's medical assistant. If no one answers, please leave a voicemail as directed and we will return your call as soon as possible. Messages left after 4 pm will be answered the following business day.   You may also send [place] a message via [place]. We typically respond to MyChart messages  within 1-2 business days.  For prescription refills, please ask your pharmacy to contact our office. Our fax number is [phone].  If you have an urgent issue when the clinic is closed that cannot wait until the next business day, you can page your doctor at the number below.    Please note that while we do our best to be available for urgent issues outside of office hours, we are not available 24/7.   If you have an urgent issue and are unable to reach [place], you may choose to seek medical care at your doctor's office, retail clinic, urgent care center, or emergency room.  If you have a medical emergency, please immediately call 911 or go to the emergency department.  Pager Numbers  - Dr. Nehemiah Massed: [phone]  - Dr. Laurence Ferrari: [phone]  - Dr. Nicole Kindred: [phone]  In the event of inclement weather, please call our main line at [phone] for an update on the status of any delays or closures.  Dermatology Medication Tips: Please keep the boxes that topical medications come in in order to help keep track of the instructions about where and how to use these. Pharmacies typically print the medication instructions only on the boxes and not directly on the medication tubes.   If your medication is too expensive, please contact our office at [phone] option 4 or send [place] a message through [place].   We are unable to tell what your co-pay for medications will be in advance as this is different depending on your insurance coverage. However, we may be able to find a substitute medication at lower cost or fill out paperwork to get insurance to cover a needed medication.   If a prior authorization is required to get your medication covered by your insurance company, please allow [place] 1-2 business days to complete this process.  Drug prices often vary depending on where the prescription is filled and some pharmacies may offer cheaper prices.  The website www.goodrx.com contains coupons for  medications through different pharmacies. The prices here do not account for what the cost may be with help from insurance (it may be cheaper with your insurance), but the website can give you the price if you did not use any insurance.  - You can print the associated coupon and take it with your prescription to the pharmacy.  - You may also stop by our office during regular business hours and pick up a GoodRx coupon card.  - If you need your prescription sent electronically to a different pharmacy, notify our office through [facility] or by phone at [phone] option 4.     Si Usted Necesita Algo Despus de Su Visita  Tambin puede enviarnos un mensaje a travs de Pharmacist, community. Por lo general respondemos a los mensajes de MyChart en el transcurso de 1 a 2 das hbiles.  Para renovar recetas, por favor pida a su  farmacia que se ponga en contacto con nuestra oficina. Harland Dingwall de fax es [place] [phone].  Si tiene un asunto urgente cuando la clnica est cerrada y que no puede esperar hasta el siguiente da hbil, puede llamar/localizar a su doctor(a) al nmero que aparece a continuacin.   Por favor, tenga en cuenta que aunque hacemos todo lo posible para estar disponibles para asuntos urgentes fuera del horario de [place], no estamos disponibles las 24 horas del da, los 7 das de la [place].   Si tiene un problema urgente y no puede comunicarse con nosotros, puede optar por buscar atencin mdica  en el consultorio de su doctor(a), en una clnica privada, en un centro de atencin urgente o en una sala de emergencias.  Si tiene Engineering geologist, por favor llame inmediatamente al 911 o vaya a la sala de emergencias.  Nmeros de bper  - Dr. Nehemiah Massed: [phone]  - Dra. Moye: [phone]  - Dra. Nicole Kindred: [phone]  En caso de inclemencias del [place], por favor llame a Johnsie Kindred principal al [phone] para una actualizacin sobre el [place] de cualquier retraso o  cierre.  Consejos para la medicacin en dermatologa: Por favor, guarde las cajas en las que vienen los medicamentos de uso tpico para ayudarle a seguir las instrucciones sobre dnde y cmo usarlos. Las farmacias generalmente imprimen las instrucciones del medicamento slo en las cajas y no directamente en los tubos del [place].   Si su medicamento es muy caro, por favor, pngase en contacto con Zigmund Daniel llamando al [phone] y presione la opcin 4 o envenos un mensaje a travs de Pharmacist, community.   No podemos decirle cul ser su copago por los medicamentos por adelantado ya que esto es diferente dependiendo de la cobertura de su seguro. Sin embargo, es posible que podamos encontrar un medicamento sustituto a Electrical engineer un formulario para que el seguro cubra el medicamento que se considera necesario.   Si se requiere una autorizacin previa para que su compaa de seguros [place] su medicamento, por favor permtanos de 1 a 2 das hbiles para completar este proceso.  Los precios de los medicamentos varan con frecuencia dependiendo del Environmental consultant de dnde se surte la receta y alguna farmacias pueden ofrecer precios ms baratos.  El sitio web www.goodrx.com tiene cupones para medicamentos de Airline pilot. Los precios aqu no tienen en cuenta lo que podra costar con la ayuda del seguro (puede ser ms barato con su seguro), pero el sitio web puede darle el precio si no utiliz Research scientist (physical sciences).  - Puede imprimir el cupn correspondiente y llevarlo con su receta a la farmacia.  - Tambin puede pasar por nuestra oficina durante el horario de atencin regular y Charity fundraiser una tarjeta de cupones de GoodRx.  - Si necesita que su receta se enve electrnicamente a una farmacia diferente, informe a nuestra oficina a travs de MyChart de [place] o por telfono llamando al [phone] y presione la opcin 4.

## 2021-08-22 NOTE — Progress Notes (Signed)
° °  New Patient Visit  Subjective  Marie Douglas is a 61 y.o. female who presents for the following: Annual Exam (Here for skin cancer screening. Waist up exam. Several areas of concern). The patient presents for Upper Body Skin Exam (UBSE) for skin cancer screening and mole check.  The patient has spots, moles and lesions to be evaluated, some may be new or changing and the patient has concerns that these could be cancer.   Review of Systems: No other skin or systemic complaints except as noted in HPI or Assessment and Plan.  Objective  Well appearing patient in no apparent distress; mood and affect are within normal limits.  All skin waist up examined.  Neck - Anterior, Right Zygomatic Area Erythematous keratotic or waxy stuck-on papule or plaque.  face Mid face erythema with telangiectasias +/- scattered inflammatory papules.   Left Cheek Yellowish papule   Assessment & Plan   Lentigines - Scattered tan macules - Due to sun exposure - Benign-appearing, observe - Recommend daily broad spectrum sunscreen SPF 30+ to sun-exposed areas, reapply every 2 hours as needed. - Call for any changes  Seborrheic Keratoses - Stuck-on, waxy, tan-brown papules and/or plaques  - Benign-appearing - Discussed benign etiology and prognosis. - Observe - Call for any changes  Melanocytic Nevi - Tan-brown and/or pink-flesh-colored symmetric macules and papules - Benign appearing on exam today - Observation - Call clinic for new or changing moles - Recommend daily use of broad spectrum spf 30+ sunscreen to sun-exposed areas.   Hemangiomas - Red papules - Discussed benign nature - Observe - Call for any changes  Actinic Damage - Chronic condition, secondary to cumulative UV/sun exposure - diffuse scaly erythematous macules with underlying dyspigmentation - Recommend daily broad spectrum sunscreen SPF 30+ to sun-exposed areas, reapply every 2 hours as needed.  - Staying in the shade or  wearing long sleeves, sun glasses (UVA+UVB protection) and wide brim hats (4-inch brim around the entire circumference of the hat) are also recommended for sun protection.  - Call for new or changing lesions.  Acrochordons (Skin Tags) - Fleshy, skin-colored pedunculated papules - Benign appearing.  - Observe. - If desired, they can be removed with an in office procedure that is not covered by insurance. - Please call the clinic if you notice any new or changing lesions.   Skin cancer screening performed today.  Inflamed seborrheic keratosis (2) Neck - Anterior; Right Zygomatic Area  Patient deferred treatment at this time.   Rosacea face  Rosacea is a chronic progressive skin condition usually affecting the face of adults, causing redness and/or acne bumps. It is treatable but not curable. It sometimes affects the eyes (ocular rosacea) as well. It may respond to topical and/or systemic medication and can flare with stress, sun exposure, alcohol, exercise and some foods.  Daily application of broad spectrum spf 30+ sunscreen to face is recommended to reduce flares.  Patient deferred treatment at this time.   Sebaceous hyperplasia Left Cheek  Benign-appearing.  Observation.  Call clinic for new or changing lesions.  Recommend daily use of broad spectrum spf 30+ sunscreen to sun-exposed areas.    Skin cancer screening   Return in about 1 year (around 08/22/2022) for TBSE.  I, Emelia Salisbury, CMA, am acting as scribe for Sarina Ser, MD. Documentation: I have reviewed the above documentation for accuracy and completeness, and I agree with the above.  Sarina Ser, MD

## 2021-08-23 ENCOUNTER — Encounter: Payer: Self-pay | Admitting: Dermatology

## 2021-08-25 ENCOUNTER — Telehealth: Payer: Self-pay | Admitting: Urology

## 2021-08-25 NOTE — Telephone Encounter (Signed)
Pt has appt scheduled in may

## 2021-08-25 NOTE — Telephone Encounter (Signed)
Metabolic stone evaluation results were reviewed.  She did have several abnormalities including low urine volume, elevated urine calcium and elevated urine pH.  Recommend scheduling routine follow-up to discuss management options.

## 2021-09-05 ENCOUNTER — Encounter: Payer: Self-pay | Admitting: Urology

## 2021-09-18 NOTE — Telephone Encounter (Signed)
error 

## 2021-11-16 ENCOUNTER — Ambulatory Visit: Payer: Commercial Managed Care - PPO | Admitting: Urology

## 2022-01-18 ENCOUNTER — Ambulatory Visit: Payer: Commercial Managed Care - PPO | Admitting: Urology

## 2022-03-15 ENCOUNTER — Ambulatory Visit
Admission: RE | Admit: 2022-03-15 | Discharge: 2022-03-15 | Disposition: A | Discharge status: Home / Self Care | Payer: Commercial Managed Care - PPO | Attending: Urology | Admitting: Urology

## 2022-03-15 ENCOUNTER — Ambulatory Visit
Admission: RE | Admit: 2022-03-15 | Discharge: 2022-03-15 | Disposition: A | Discharge status: Home / Self Care | Payer: Commercial Managed Care - PPO | Source: Ambulatory Visit | Attending: Urology | Admitting: Urology

## 2022-03-15 ENCOUNTER — Encounter: Payer: Self-pay | Admitting: Urology

## 2022-03-15 ENCOUNTER — Ambulatory Visit (INDEPENDENT_AMBULATORY_CARE_PROVIDER_SITE_OTHER): Payer: Commercial Managed Care - PPO | Admitting: Urology

## 2022-03-15 VITALS — BP 148/82 | HR 74 | Ht 68.0 in | Wt 205.0 lb

## 2022-03-15 DIAGNOSIS — N200 Calculus of kidney: Secondary | ICD-10-CM | POA: Insufficient documentation

## 2022-03-15 LAB — URINALYSIS, COMPLETE
Bilirubin, UA: NEGATIVE
Glucose, UA: NEGATIVE
Ketones, UA: NEGATIVE
Nitrite, UA: NEGATIVE
Protein,UA: NEGATIVE
Specific Gravity, UA: 1.01 (ref 1.005–1.030)
Urobilinogen, Ur: 0.2 mg/dL (ref 0.2–1.0)
pH, UA: 7 (ref 5.0–7.5)

## 2022-03-15 LAB — MICROSCOPIC EXAMINATION

## 2022-03-15 NOTE — Progress Notes (Signed)
03/15/2022 1:17 PM   Marie Douglas [birth year] [id number]  Referring provider: [address] [place],  [place] [address]  Chief Complaint  Patient presents with   Hematuria    Urologic history: 1.  Gross hematuria Seen July 2022 for intermittent gross hematuria.  Was on Eliquis for hx of DVT CT performed at [facility] showed nonobstructing right lower pole calculi and bilateral renal cysts; cystoscopy showed no lower tract abnormalities  2.  Nephrolithiasis CT as above Stone analysis 05/2021 CaOxMono/CaOxDi/hydroxyapatite: 40/30/30   HPI: 61 y.o. female presents for follow-up visit.  No recurrent gross hematuria Denies flank, abdominal or pelvic pain She is considering starting a beet supplement for hypertension and the labeling recommended use with caution with a history of stone disease   PMH: Past Medical History:  Diagnosis Date   Anemia    Anxiety    Colorectal cancer ([place]) 08/19/2020   Formatting of this note might be different from the original. S/p right hemicolectomy with primary anastomosis 08/05/2020   Depression    Gallstones    History of blood clots 09/2020   Kidney stones     Surgical History: Past Surgical History:  Procedure Laterality Date   COLON RESECTION  2021   PORTACATH PLACEMENT  2021    Home Medications:  Allergies as of 03/15/2022       Reactions   Epinephrine Other (See Comments)   Makes her want to jump out of her skin   Ciprofloxacin Other (See Comments)   Bad taste in her mouth         Medication List        Accurate as of March 15, 2022  1:17 PM. If you have any questions, ask your nurse or doctor.          amLODipine 10 MG tablet Commonly known as: NORVASC Take 10 mg by mouth daily.   escitalopram 10 MG tablet Commonly known as: LEXAPRO Take by mouth.   levothyroxine 50 MCG tablet Commonly known as: SYNTHROID Take 50 mcg by mouth daily.        Allergies:  Allergies  Allergen  Reactions   Epinephrine Other (See Comments)    Makes her want to jump out of her skin    Ciprofloxacin Other (See Comments)    Bad taste in her mouth     Family History: Family History  Problem Relation Age of Onset   Hypertension Maternal Grandmother    Diabetes Maternal Grandmother    Breast cancer Neg Hx     Social History:  reports that she has quit smoking. Her smoking use included cigarettes. She has a 10.00 pack-year smoking history. She has never used smokeless tobacco. She reports that she does not currently use alcohol. She reports that she does not use drugs.   Physical Exam: BP (!) 148/82   Pulse 74   Ht '5\' 8"'$  (1.727 m)   Wt 205 lb (93 kg)   BMI 31.17 kg/m   Constitutional:  Alert and oriented, No acute distress. HEENT: [place] AT Respiratory: Normal respiratory effort, no increased work of breathing. Neurologic: Grossly intact, no focal deficits, moving all 4 extremities. Psychiatric: Normal mood and affect.  Laboratory Data:  Urinalysis Microscopy 6-10 WBC/3-10 RBC   Assessment & Plan:    1. Nephrolithiasis KUB ordered today and if there is interval stone growth will proceed with a metabolic stone evaluation If no significant change I think she is okay to start the beet supplement but recommended  increasing water/fluid intake to keep urine output 2-2.5 L/day She will be notified with her KUB results and further recommendations Pending scheduling of a follow-up CT for colon cancer at [facility] in October 2023 and will review once it is done   Abbie Sons, [place] [address] [place], [place] [address] [phone]

## 2022-03-21 ENCOUNTER — Encounter: Payer: Self-pay | Admitting: *Deleted

## 2022-04-14 ENCOUNTER — Encounter: Payer: Self-pay | Admitting: Urology

## 2022-04-27 ENCOUNTER — Encounter: Payer: Self-pay | Admitting: Urology

## 2022-05-24 ENCOUNTER — Other Ambulatory Visit: Payer: Self-pay | Admitting: Student

## 2022-05-24 DIAGNOSIS — Z1231 Encounter for screening mammogram for malignant neoplasm of breast: Secondary | ICD-10-CM

## 2022-06-28 ENCOUNTER — Ambulatory Visit: Payer: Commercial Managed Care - PPO | Admitting: Obstetrics and Gynecology

## 2022-08-01 NOTE — Progress Notes (Deleted)
ANNUAL PREVENTATIVE CARE GYNECOLOGY  ENCOUNTER NOTE  Subjective:       Marie Douglas is a 62 y.o. G74P0010 female here for a routine annual gynecologic exam. The patient has a history of colon cancer, diagnosed last year. Has had surgery and underwent chemotherapy. The patient is sexually active. The patient is not taking hormone replacement therapy. Patient denies post-menopausal vaginal bleeding. The patient wears seatbelts: yes. The patient participates in regular exercise: no. Has the patient ever been transfused or tattooed?: not asked. The patient reports that there is not domestic violence in her life.    Current complaints: 1.  ***    Gynecologic History No LMP recorded. Patient is postmenopausal. Contraception: post menopausal status Last Pap: 06/27/2021. Results were: normal Last mammogram: 06/06/2021. Results were: normal Last Colonoscopy: Never done Last Dexa Scan: Never done   Obstetric History OB History  Gravida Para Term Preterm AB Living  1       1    SAB IAB Ectopic Multiple Live Births  1            # Outcome Date GA Lbr Len/2nd Weight Sex Delivery Anes PTL Lv  1 SAB             Past Medical History:  Diagnosis Date   Anemia    Anxiety    Colorectal cancer ([place]) 08/19/2020   Formatting of this note might be different from the original. S/p right hemicolectomy with primary anastomosis 08/05/2020   Depression    Gallstones    History of blood clots 09/2020   Kidney stones     Family History  Problem Relation Age of Onset   Hypertension Maternal Grandmother    Diabetes Maternal Grandmother    Breast cancer Neg Hx     Past Surgical History:  Procedure Laterality Date   COLON RESECTION  2021   PORTACATH PLACEMENT  2021    Social History   Socioeconomic History   Marital status: Married    Spouse name: Not on file   Number of children: Not on file   Years of education: Not on file   Highest education level: Not on file  Occupational History    Not on file  Tobacco Use   Smoking status: Former    Packs/day: 1.00    Years: 10.00    Total pack years: 10.00    Types: Cigarettes   Smokeless tobacco: Never  Vaping Use   Vaping Use: Never used  Substance and Sexual Activity   Alcohol use: Not Currently   Drug use: Never   Sexual activity: Not on file  Other Topics Concern   Not on file  Social History Narrative   Not on file   Social Determinants of Health   Financial Resource Strain: Not on file  Food Insecurity: Not on file  Transportation Needs: Not on file  Physical Activity: Not on file  Stress: Not on file  Social Connections: Not on file  Intimate Partner Violence: Not on file    Current Outpatient Medications on File Prior to Visit  Medication Sig Dispense Refill   amLODipine (NORVASC) 10 MG tablet Take 10 mg by mouth daily.     escitalopram (LEXAPRO) 10 MG tablet Take by mouth.     levothyroxine (SYNTHROID) 50 MCG tablet Take 50 mcg by mouth daily.     No current facility-administered medications on file prior to visit.    Allergies  Allergen Reactions   Epinephrine Other (See Comments)  Makes her want to jump out of her skin    Ciprofloxacin Other (See Comments)    Bad taste in her mouth       Review of Systems ROS Review of Systems - General ROS: negative for - chills, fatigue, fever, hot flashes, night sweats, weight gain or weight loss Psychological ROS: negative for - anxiety, decreased libido, depression, mood swings, physical abuse or sexual abuse Ophthalmic ROS: negative for - blurry vision, eye pain or loss of vision ENT ROS: negative for - headaches, hearing change, visual changes or vocal changes Allergy and Immunology ROS: negative for - hives, itchy/watery eyes or seasonal allergies Hematological and Lymphatic ROS: negative for - bleeding problems, bruising, swollen lymph nodes or weight loss Endocrine ROS: negative for - galactorrhea, hair pattern changes, hot flashes,  malaise/lethargy, mood swings, palpitations, polydipsia/polyuria, skin changes, temperature intolerance or unexpected weight changes Breast ROS: negative for - new or changing breast lumps or nipple discharge Respiratory ROS: negative for - cough or shortness of breath Cardiovascular ROS: negative for - chest pain, irregular heartbeat, palpitations or shortness of breath Gastrointestinal ROS: no abdominal pain, change in bowel habits, or black or bloody stools Genito-Urinary ROS: no dysuria, trouble voiding, or hematuria Musculoskeletal ROS: negative for - joint pain or joint stiffness Neurological ROS: negative for - bowel and bladder control changes Dermatological ROS: negative for rash and skin lesion changes   Objective:   There were no vitals taken for this visit. CONSTITUTIONAL: Well-developed, well-nourished female in no acute distress.  PSYCHIATRIC: Normal mood and affect. Normal behavior. Normal judgment and thought content. [place]: Alert and oriented to person, place, and time. Normal muscle tone coordination. No cranial nerve deficit noted. HENT:  Normocephalic, atraumatic, External right and left ear normal. Oropharynx is clear and moist EYES: Conjunctivae and EOM are normal. Pupils are equal, round, and reactive to light. No scleral icterus.  NECK: Normal range of motion, supple, no masses.  Normal thyroid.  SKIN: Skin is warm and dry. No rash noted. Not diaphoretic. No erythema. No pallor. CARDIOVASCULAR: Normal heart rate noted, regular rhythm, no murmur. RESPIRATORY: Clear to auscultation bilaterally. Effort and breath sounds normal, no problems with respiration noted. BREASTS: Symmetric in size. No masses, skin changes, nipple drainage, or lymphadenopathy. ABDOMEN: Soft, normal bowel sounds, no distention noted.  No tenderness, rebound or guarding.  BLADDER: Normal PELVIC:  Bladder {:311640}  Urethra: {:311719}  Vulva: {:311722}  Vagina: {:311643}  Cervix:  {:311644}  Uterus: {:311718}  Adnexa: {:311645}  RV: {Blank multiple:19196::"External Exam NormaI","No Rectal Masses","Normal Sphincter tone"}  MUSCULOSKELETAL: Normal range of motion. No tenderness.  No cyanosis, clubbing, or edema.  2+ distal pulses. LYMPHATIC: No Axillary, Supraclavicular, or Inguinal Adenopathy.   Labs: No results found for: "WBC", "HGB", "HCT", "MCV", "PLT"  No results found for: "CREATININE", "BUN", "NA", "K", "CL", "CO2"  No results found for: "ALT", "AST", "GGT", "ALKPHOS", "BILITOT"  No results found for: "CHOL", "HDL", "LDLCALC", "LDLDIRECT", "TRIG", "CHOLHDL"  No results found for: "TSH"  No results found for: "HGBA1C"   Assessment:   No diagnosis found.   Plan:  Pap:  UTD Mammogram: Ordered Colon Screening:  *** Labs: {Blank multiple:19196::"Lipid 1","FBS","TSH","Hemoglobin A1C","Vit D Level""***"} Routine preventative health maintenance measures emphasized: Exercise/Diet/Weight control, Tobacco Warnings, Alcohol/Substance use risks, Stress Management, Peer Pressure Issues, and Safe Sex COVID Vaccination status: Return to [place], [place]

## 2022-08-01 NOTE — Patient Instructions (Incomplete)

## 2022-08-02 ENCOUNTER — Ambulatory Visit: Payer: Commercial Managed Care - PPO | Admitting: Obstetrics and Gynecology

## 2022-08-02 ENCOUNTER — Telehealth: Payer: Self-pay

## 2022-08-02 DIAGNOSIS — Z1231 Encounter for screening mammogram for malignant neoplasm of breast: Secondary | ICD-10-CM

## 2022-08-02 DIAGNOSIS — Z01419 Encounter for gynecological examination (general) (routine) without abnormal findings: Secondary | ICD-10-CM

## 2022-08-02 NOTE — Telephone Encounter (Signed)
Pt calling; had to reschedule her annual c ASC; does she need to move her mammogram as well?  [phone]  Adv pt she did not need to move her mammogram appt.

## 2022-08-19 IMAGING — MG MM DIGITAL SCREENING BILAT W/ TOMO AND CAD
8 series · 8 of 24 positions shown · non-contrast
Comparison: None.

CLINICAL DATA: Screening.

EXAM:
DIGITAL SCREENING BILATERAL MAMMOGRAM WITH TOMOSYNTHESIS AND CAD
TECHNIQUE: Bilateral screening digital craniocaudal and mediolateral oblique
mammograms were obtained. Bilateral screening digital breast
tomosynthesis was performed. The images were evaluated with
computer-aided detection.

[L CC synth-2D]
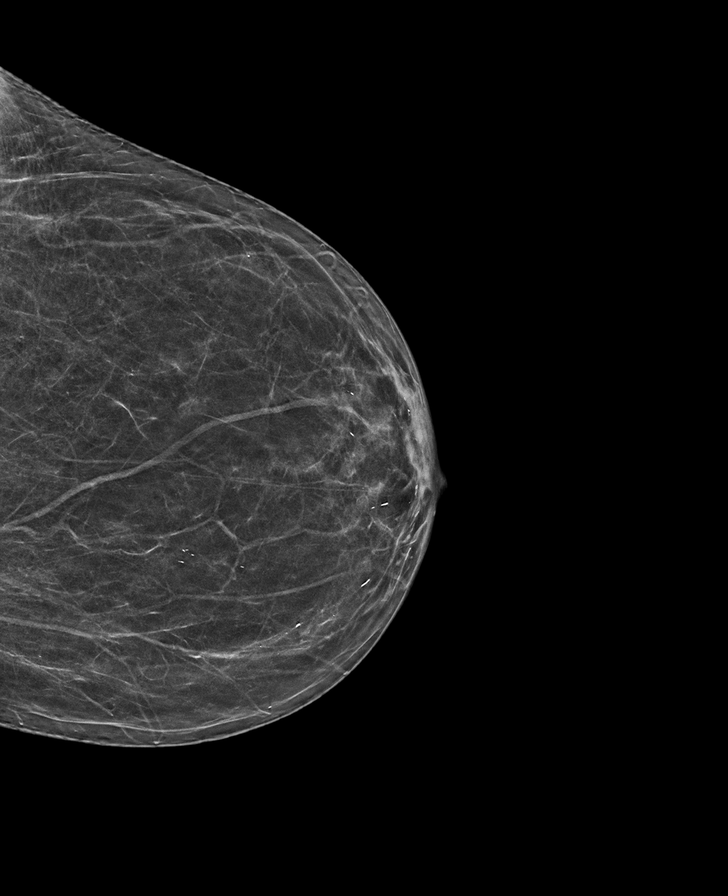

[L MLO synth-2D]
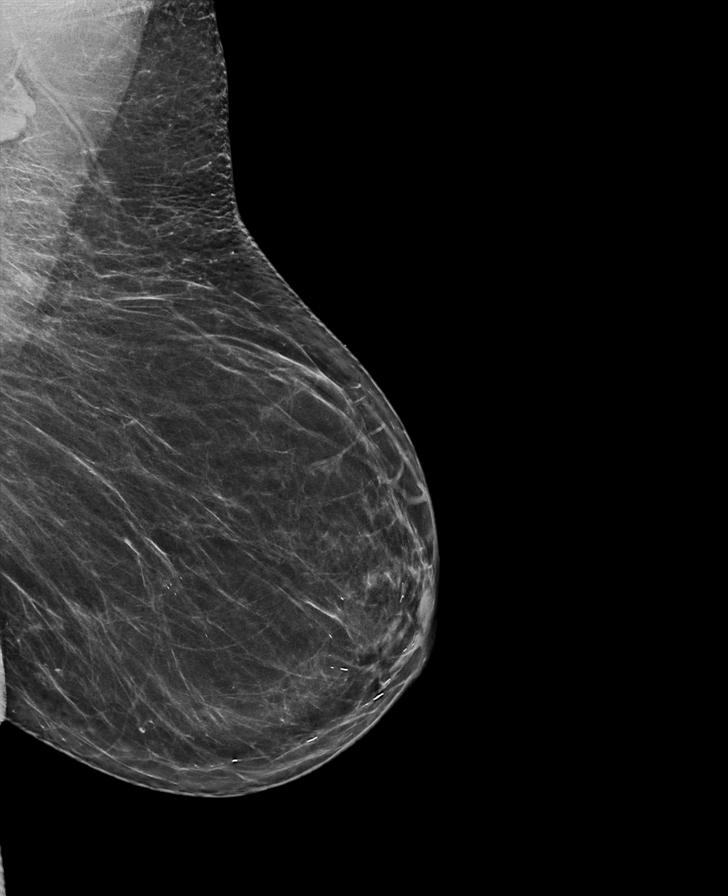

[R CC synth-2D]
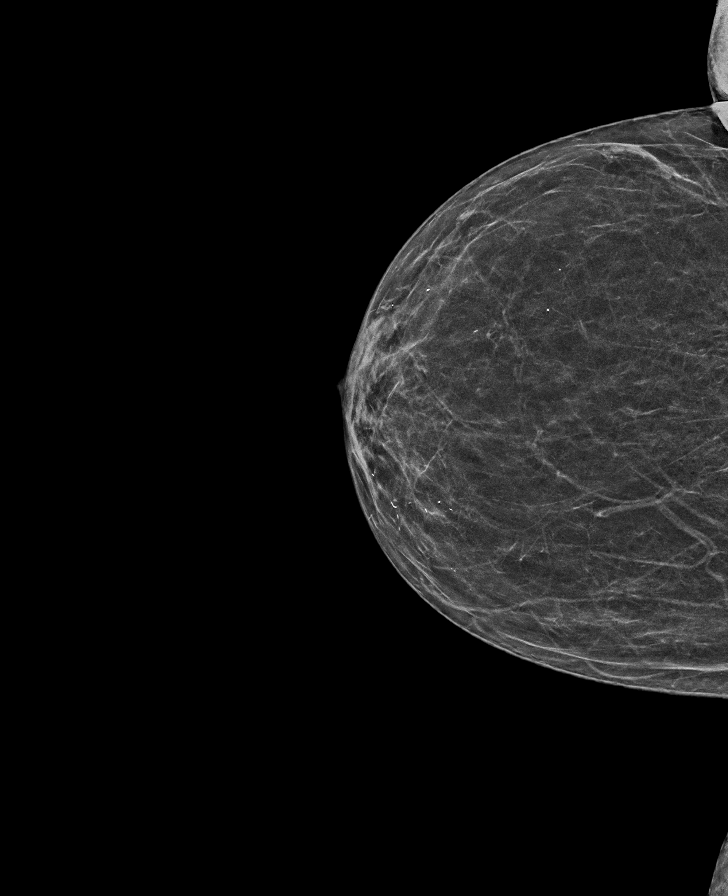

[R MLO synth-2D]
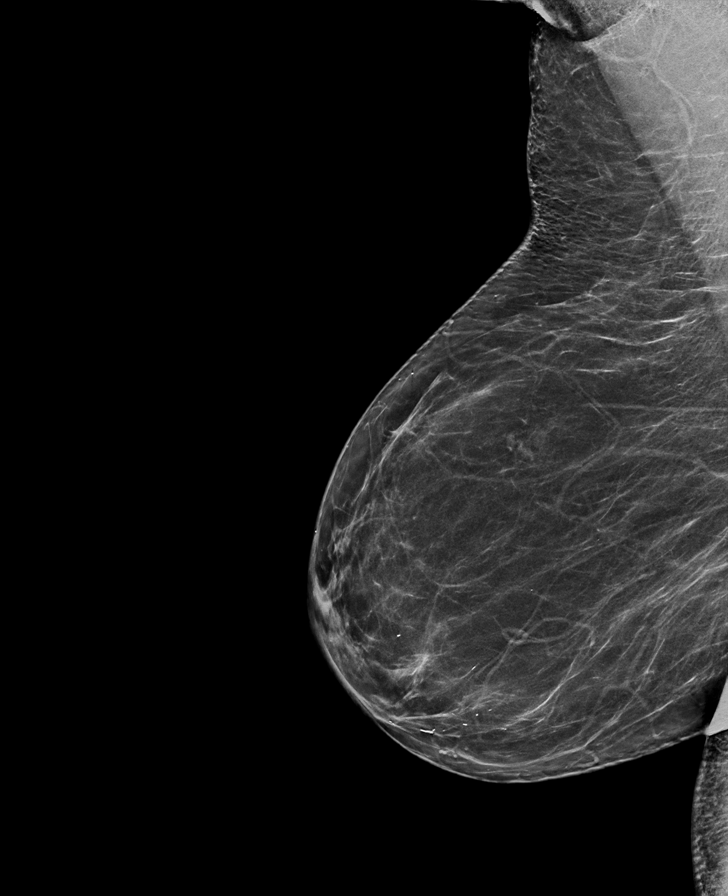

[L CC tomo · tomo slice 29/56.0]
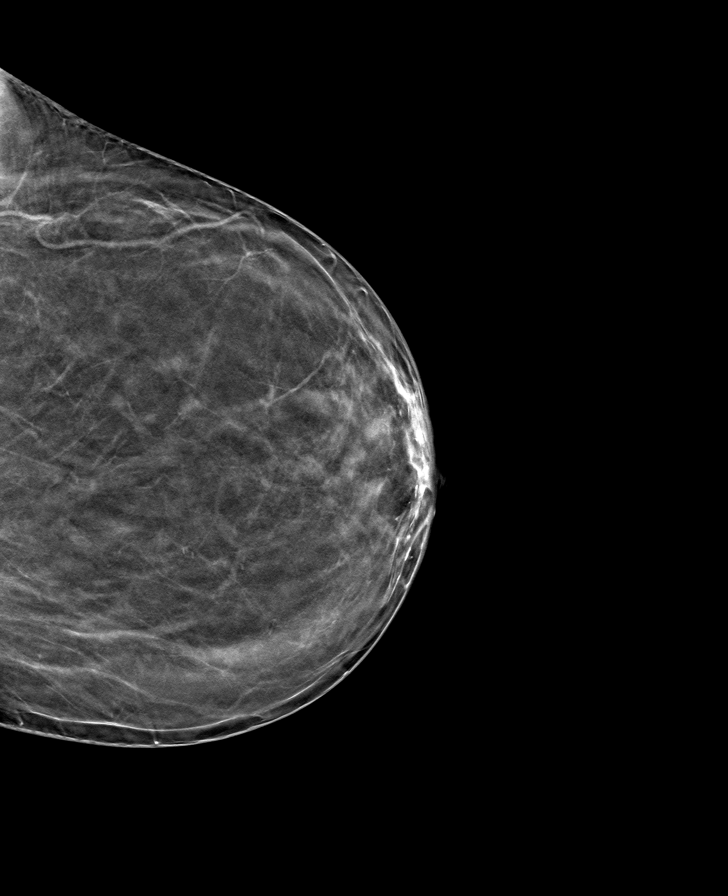

[R CC tomo · tomo slice 27/54.0]
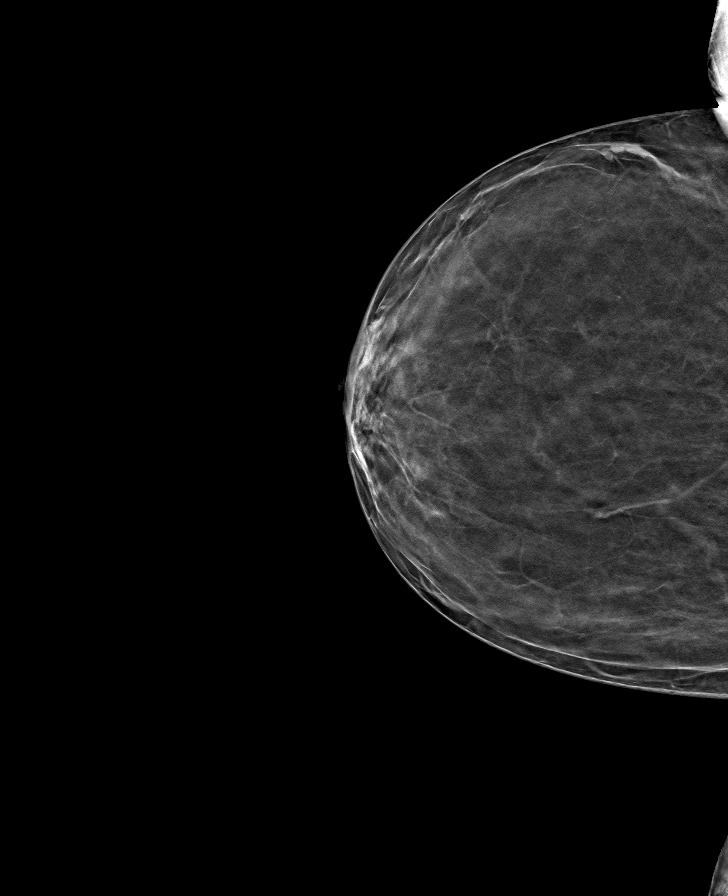

[R MLO tomo · tomo slice 33/66.0]
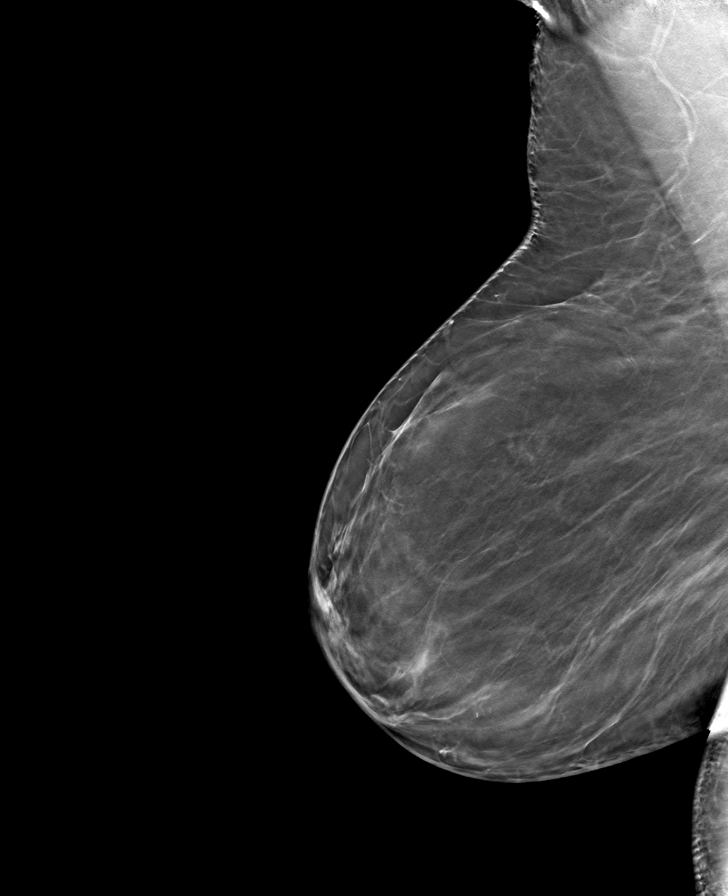

[L MLO tomo · tomo slice 34/67.0]
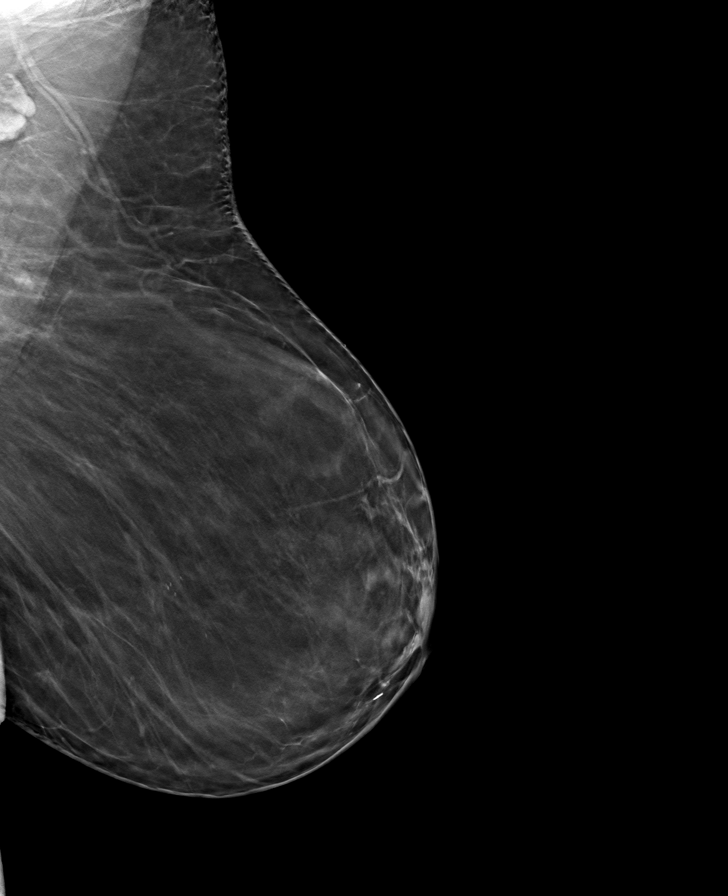

[8 of 24 positions shown; findings below may reference images not displayed]

ACR Breast Density Category b: There are scattered areas of
fibroglandular density.
FINDINGS: There are no findings suspicious for malignancy.
IMPRESSION: No mammographic evidence of malignancy. A result letter of this
screening mammogram will be mailed directly to the patient.

RECOMMENDATION:
Screening mammogram in one year. (Code:[id number]-[id number])

BI-RADS CATEGORY  1: Negative.

## 2022-08-28 ENCOUNTER — Ambulatory Visit: Payer: Commercial Managed Care - PPO | Admitting: Dermatology

## 2022-09-11 ENCOUNTER — Ambulatory Visit: Payer: Commercial Managed Care - PPO | Admitting: Dermatology

## 2022-09-18 ENCOUNTER — Other Ambulatory Visit: Payer: Self-pay | Admitting: *Deleted

## 2022-09-18 DIAGNOSIS — N200 Calculus of kidney: Secondary | ICD-10-CM

## 2022-09-20 ENCOUNTER — Ambulatory Visit: Payer: Commercial Managed Care - PPO | Admitting: Urology

## 2022-09-20 ENCOUNTER — Ambulatory Visit
Admission: RE | Admit: 2022-09-20 | Discharge: 2022-09-20 | Disposition: A | Discharge status: Home / Self Care | Payer: Commercial Managed Care - PPO | Source: Ambulatory Visit | Attending: Student | Admitting: Student

## 2022-09-20 DIAGNOSIS — Z1231 Encounter for screening mammogram for malignant neoplasm of breast: Secondary | ICD-10-CM

## 2022-09-27 ENCOUNTER — Ambulatory Visit: Payer: Commercial Managed Care - PPO | Admitting: Urology

## 2022-10-04 ENCOUNTER — Ambulatory Visit: Payer: Commercial Managed Care - PPO | Admitting: Obstetrics and Gynecology

## 2022-11-08 ENCOUNTER — Ambulatory Visit (INDEPENDENT_AMBULATORY_CARE_PROVIDER_SITE_OTHER): Payer: Commercial Managed Care - PPO | Admitting: Dermatology

## 2022-11-08 VITALS — BP 178/86

## 2022-11-08 DIAGNOSIS — D485 Neoplasm of uncertain behavior of skin: Secondary | ICD-10-CM | POA: Diagnosis not present

## 2022-11-08 DIAGNOSIS — L814 Other melanin hyperpigmentation: Secondary | ICD-10-CM

## 2022-11-08 DIAGNOSIS — Z1283 Encounter for screening for malignant neoplasm of skin: Secondary | ICD-10-CM

## 2022-11-08 DIAGNOSIS — L820 Inflamed seborrheic keratosis: Secondary | ICD-10-CM

## 2022-11-08 DIAGNOSIS — D229 Melanocytic nevi, unspecified: Secondary | ICD-10-CM

## 2022-11-08 DIAGNOSIS — L578 Other skin changes due to chronic exposure to nonionizing radiation: Secondary | ICD-10-CM | POA: Diagnosis not present

## 2022-11-08 DIAGNOSIS — L821 Other seborrheic keratosis: Secondary | ICD-10-CM

## 2022-11-08 NOTE — Patient Instructions (Addendum)
Shave Excision Benign Lesion Wound Care Instructions  Leave the original bandage on for 24 hours if possible.  If the bandage becomes soaked or soiled before that time, it is OK to remove it and examine the wound.  A small amount of post-operative bleeding is normal.  If excessive bleeding occurs, remove the bandage, place gauze over the site and apply continuous pressure (no peeking) over the area for 20-30 minutes.  If this does not stop the bleeding, try again for 40 minutes.  If this does not work, please call our clinic as soon as possible (even if after-hours).    Twice a day, cleanse the wound with soap and water.  If a thick crust develops you may use a Q-tip dipped into dilute hydrogen peroxide (mix 1:1 with water) to dissolve it.  Hydrogen peroxide can slow the healing process, so use it only as needed.  After washing, apply Vaseline jelly or Polysporin ointment.  For best healing, the wound should be covered with a layer of ointment at all times.  This may mean re-applying the ointment several times a day.  For open wounds, continue until it has healed.    If you have any swelling, keep the area elevated.  Some redness, tenderness and white or yellow material in the wound is normal healing.  If the area becomes very sore and red, or develops a thick yellow-green material (pus), it may be infected; please notify us.    Wound healing continues for up to one year following surgery.  It is not unusual to experience pain in the scar from time to time during the interval.  If the pain becomes severe or the scar thickens, you should notify the office.  A slight amount of redness in a scar is expected for the first six months.  After six months, the redness subsides and the scar will soften and fade.  The color difference becomes less noticeable with time.  If there are any problems, return for a post-op surgery check at your earliest convenience.  Please call our office for any questions or  concerns.  Cryotherapy Aftercare  Wash gently with soap and water everyday.   Apply Vaseline and Band-Aid daily until healed.    Due to recent changes in healthcare laws, you may see results of your pathology and/or laboratory studies on MyChart before the doctors have had a chance to review them. We understand that in some cases there may be results that are confusing or concerning to you. Please understand that not all results are received at the same time and often the doctors may need to interpret multiple results in order to provide you with the best plan of care or course of treatment. Therefore, we ask that you please give us 2 business days to thoroughly review all your results before contacting the office for clarification. Should we see a critical lab result, you will be contacted sooner.   If You Need Anything After Your Visit  If you have any questions or concerns for your doctor, please call our main line at [phone] and press option 4 to reach your doctor's medical assistant. If no one answers, please leave a voicemail as directed and we will return your call as soon as possible. Messages left after 4 pm will be answered the following business day.   You may also send us a message via MyChart. We typically respond to MyChart messages within 1-2 business days.  For prescription refills, please ask your pharmacy   to contact our office. Our fax number is [phone].  If you have an urgent issue when the clinic is closed that cannot wait until the next business day, you can page your doctor at the number below.    Please note that while we do our best to be available for urgent issues outside of office hours, we are not available 24/7.   If you have an urgent issue and are unable to reach us, you may choose to seek medical care at your doctor's office, retail clinic, urgent care center, or emergency room.  If you have a medical emergency, please immediately call 911 or go to  the emergency department.  Pager Numbers  - Dr. Kowalski: [phone]  - Dr. Moye: [phone]  - Dr. Stewart: [phone]  In the event of inclement weather, please call our main line at [phone] for an update on the status of any delays or closures.  Dermatology Medication Tips: Please keep the boxes that topical medications come in in order to help keep track of the instructions about where and how to use these. Pharmacies typically print the medication instructions only on the boxes and not directly on the medication tubes.   If your medication is too expensive, please contact our office at [phone] option 4 or send us a message through MyChart.   We are unable to tell what your co-pay for medications will be in advance as this is different depending on your insurance coverage. However, we may be able to find a substitute medication at lower cost or fill out paperwork to get insurance to cover a needed medication.   If a prior authorization is required to get your medication covered by your insurance company, please allow us 1-2 business days to complete this process.  Drug prices often vary depending on where the prescription is filled and some pharmacies may offer cheaper prices.  The website www.goodrx.com contains coupons for medications through different pharmacies. The prices here do not account for what the cost may be with help from insurance (it may be cheaper with your insurance), but the website can give you the price if you did not use any insurance.  - You can print the associated coupon and take it with your prescription to the pharmacy.  - You may also stop by our office during regular business hours and pick up a GoodRx coupon card.  - If you need your prescription sent electronically to a different pharmacy, notify our office through [facility] MyChart or by phone at [phone] option 4.     Si Usted Necesita Algo Despus de Su Visita  Tambin puede  enviarnos un mensaje a travs de MyChart. Por lo general respondemos a los mensajes de MyChart en el transcurso de 1 a 2 das hbiles.  Para renovar recetas, por favor pida a su farmacia que se ponga en contacto con nuestra oficina. Nuestro nmero de fax es el [phone].  Si tiene un asunto urgente cuando la clnica est cerrada y que no puede esperar hasta el siguiente da hbil, puede llamar/localizar a su doctor(a) al nmero que aparece a continuacin.   Por favor, tenga en cuenta que aunque hacemos todo lo posible para estar disponibles para asuntos urgentes fuera del horario de oficina, no estamos disponibles las 24 horas del da, los 7 das de la semana.   Si tiene un problema urgente y no puede comunicarse con nosotros, puede optar por buscar atencin mdica  en el consultorio de su doctor(a), en una   clnica privada, en un centro de atencin urgente o en una sala de emergencias.  Si tiene una emergencia mdica, por favor llame inmediatamente al 911 o vaya a la sala de emergencias.  Nmeros de bper  - Dr. Kowalski: [phone]  - Dra. Moye: [phone]  - Dra. Stewart: [phone]  En caso de inclemencias del tiempo, por favor llame a nuestra lnea principal al [phone] para una actualizacin sobre el estado de cualquier retraso o cierre.  Consejos para la medicacin en dermatologa: Por favor, guarde las cajas en las que vienen los medicamentos de uso tpico para ayudarle a seguir las instrucciones sobre dnde y cmo usarlos. Las farmacias generalmente imprimen las instrucciones del medicamento slo en las cajas y no directamente en los tubos del medicamento.   Si su medicamento es muy caro, por favor, pngase en contacto con nuestra oficina llamando al [phone] y presione la opcin 4 o envenos un mensaje a travs de MyChart.   No podemos decirle cul ser su copago por los medicamentos por adelantado ya que esto es diferente dependiendo de la cobertura de su seguro.  Sin embargo, es posible que podamos encontrar un medicamento sustituto a menor costo o llenar un formulario para que el seguro cubra el medicamento que se considera necesario.   Si se requiere una autorizacin previa para que su compaa de seguros cubra su medicamento, por favor permtanos de 1 a 2 das hbiles para completar este proceso.  Los precios de los medicamentos varan con frecuencia dependiendo del lugar de dnde se surte la receta y alguna farmacias pueden ofrecer precios ms baratos.  El sitio web www.goodrx.com tiene cupones para medicamentos de diferentes farmacias. Los precios aqu no tienen en cuenta lo que podra costar con la ayuda del seguro (puede ser ms barato con su seguro), pero el sitio web puede darle el precio si no utiliz ningn seguro.  - Puede imprimir el cupn correspondiente y llevarlo con su receta a la farmacia.  - Tambin puede pasar por nuestra oficina durante el horario de atencin regular y recoger una tarjeta de cupones de GoodRx.  - Si necesita que su receta se enve electrnicamente a una farmacia diferente, informe a nuestra oficina a travs de MyChart de [facility] o por telfono llamando al [phone] y presione la opcin 4.  

## 2022-11-08 NOTE — Progress Notes (Signed)
   Follow-Up Visit   Subjective  Marie Douglas is a 62 y.o. female who presents for the following: Skin Cancer Screening and Upper Body Skin Exam  The patient presents for Upper Body Skin Exam (UBSE) for skin cancer screening and mole check. The patient has spots, moles and lesions to be evaluated, some may be new or changing and the patient has concerns that these could be cancer.    The following portions of the chart were reviewed this encounter and updated as appropriate: medications, allergies, medical history  Review of Systems:  No other skin or systemic complaints except as noted in HPI or Assessment and Plan.  Objective  Well appearing patient in no apparent distress; mood and affect are within normal limits.  All skin waist up examined. Relevant physical exam findings are noted in the Assessment and Plan.  Right temple Flesh colored papule  Ant neck, right neck (15) Erythematous stuck-on, waxy papule or plaque    Assessment & Plan   Neoplasm of uncertain behavior of skin Right temple  Epidermal / dermal shaving  Lesion diameter (cm):  1.2  Specimen 1 - Surgical pathology Differential Diagnosis: Irritated nevus vs Inflamed SK R/O Dysplasia Check Margins: No  Inflamed seborrheic keratosis (15) Ant neck, right neck  Destruction of lesion - Ant neck, right neck Complexity: simple   Destruction method: cryotherapy   Informed consent: discussed and consent obtained   Timeout:  patient name, date of birth, surgical site, and procedure verified Lesion destroyed using liquid nitrogen: Yes   Region frozen until ice ball extended beyond lesion: Yes   Outcome: patient tolerated procedure well with no complications   Post-procedure details: wound care instructions given     Lentigines, Seborrheic Keratoses, Hemangiomas - Benign normal skin lesions - Benign-appearing - Call for any changes  Melanocytic Nevi - Tan-brown and/or pink-flesh-colored symmetric macules  and papules - Benign appearing on exam today - Observation - Call clinic for new or changing moles - Recommend daily use of broad spectrum spf 30+ sunscreen to sun-exposed areas.   Actinic Damage - Chronic condition, secondary to cumulative UV/sun exposure - diffuse scaly erythematous macules with underlying dyspigmentation - Recommend daily broad spectrum sunscreen SPF 30+ to sun-exposed areas, reapply every 2 hours as needed.  - Staying in the shade or wearing long sleeves, sun glasses (UVA+UVB protection) and wide brim hats (4-inch brim around the entire circumference of the hat) are also recommended for sun protection.  - Call for new or changing lesions.  Skin cancer screening performed today.  Return in about 3 months (around 02/07/2023) for ISK follow up.  I, Joanie Coddington, CMA, am acting as scribe for Armida Sans, MD .   Documentation: I have reviewed the above documentation for accuracy and completeness, and I agree with the above.  Armida Sans, MD

## 2022-11-13 ENCOUNTER — Telehealth: Payer: Self-pay

## 2022-11-13 NOTE — Telephone Encounter (Signed)
-----   Message from Deirdre Evener, MD sent at 11/12/2022  2:12 PM EDT ----- Diagnosis Skin , right temple SEBORRHEIC KERATOSIS, IRRITATED  Benign irritated keratosis No further treatment needed.

## 2022-11-13 NOTE — Telephone Encounter (Signed)
Advised pt of bx results/sh ?

## 2022-11-16 ENCOUNTER — Encounter: Payer: Self-pay | Admitting: Dermatology

## 2022-12-13 ENCOUNTER — Ambulatory Visit: Payer: Commercial Managed Care - PPO | Admitting: Obstetrics and Gynecology

## 2023-01-05 ENCOUNTER — Ambulatory Visit: Payer: Commercial Managed Care - PPO | Admitting: Obstetrics and Gynecology

## 2023-01-31 ENCOUNTER — Telehealth: Payer: Self-pay

## 2023-01-31 NOTE — Telephone Encounter (Signed)
 ERROR

## 2023-02-14 ENCOUNTER — Ambulatory Visit: Payer: Commercial Managed Care - PPO | Admitting: Dermatology

## 2023-02-15 ENCOUNTER — Ambulatory Visit: Payer: Commercial Managed Care - PPO | Admitting: Dermatology

## 2023-02-15 ENCOUNTER — Encounter: Payer: Self-pay | Admitting: Urology

## 2023-03-16 ENCOUNTER — Ambulatory Visit: Payer: Commercial Managed Care - PPO | Admitting: Urology

## 2023-08-09 ENCOUNTER — Ambulatory Visit: Payer: Commercial Managed Care - PPO | Admitting: Orthopaedic Surgery

## 2023-08-15 ENCOUNTER — Ambulatory Visit: Payer: Commercial Managed Care - PPO | Admitting: Urology

## 2023-09-05 ENCOUNTER — Ambulatory Visit: Payer: Commercial Managed Care - PPO | Admitting: Urology

## 2023-10-18 ENCOUNTER — Ambulatory Visit: Payer: Self-pay | Admitting: Urology

## 2023-10-29 ENCOUNTER — Other Ambulatory Visit: Payer: Self-pay | Admitting: Student

## 2023-10-29 DIAGNOSIS — Z1231 Encounter for screening mammogram for malignant neoplasm of breast: Secondary | ICD-10-CM

## 2023-11-12 ENCOUNTER — Ambulatory Visit
Admission: RE | Admit: 2023-11-12 | Discharge: 2023-11-12 | Disposition: A | Discharge status: Home / Self Care | Source: Ambulatory Visit | Attending: Student | Admitting: Student

## 2023-11-12 DIAGNOSIS — Z1231 Encounter for screening mammogram for malignant neoplasm of breast: Secondary | ICD-10-CM | POA: Diagnosis present

## 2023-11-14 ENCOUNTER — Ambulatory Visit: Payer: Commercial Managed Care - PPO | Admitting: Urology

## 2023-11-15 ENCOUNTER — Other Ambulatory Visit: Payer: Self-pay | Admitting: Nurse Practitioner

## 2023-11-15 DIAGNOSIS — R928 Other abnormal and inconclusive findings on diagnostic imaging of breast: Secondary | ICD-10-CM

## 2023-11-16 ENCOUNTER — Encounter: Payer: Self-pay | Admitting: Radiology

## 2023-11-19 ENCOUNTER — Ambulatory Visit
Admission: RE | Admit: 2023-11-19 | Discharge: 2023-11-19 | Disposition: A | Discharge status: Home / Self Care | Source: Ambulatory Visit | Attending: Nurse Practitioner | Admitting: Nurse Practitioner

## 2023-11-19 DIAGNOSIS — R928 Other abnormal and inconclusive findings on diagnostic imaging of breast: Secondary | ICD-10-CM | POA: Diagnosis present

## 2023-12-05 ENCOUNTER — Ambulatory Visit: Admitting: Urology

## 2024-05-19 ENCOUNTER — Encounter: Payer: Self-pay | Admitting: Radiology
